# Patient Record
Sex: Female | Born: 1960
Health system: Southern US, Community
[De-identification: ages and names within clinical notes are randomized; demographics above are authoritative.]

## PROBLEM LIST (undated history)

## (undated) DIAGNOSIS — K635 Polyp of colon: Secondary | ICD-10-CM

## (undated) DIAGNOSIS — F329 Major depressive disorder, single episode, unspecified: Secondary | ICD-10-CM

## (undated) DIAGNOSIS — F32A Depression, unspecified: Secondary | ICD-10-CM

## (undated) DIAGNOSIS — Z923 Personal history of irradiation: Secondary | ICD-10-CM

## (undated) DIAGNOSIS — Z853 Personal history of malignant neoplasm of breast: Secondary | ICD-10-CM

## (undated) DIAGNOSIS — E7800 Pure hypercholesterolemia, unspecified: Secondary | ICD-10-CM

## (undated) DIAGNOSIS — D071 Carcinoma in situ of vulva: Secondary | ICD-10-CM

## (undated) HISTORY — DX: Polyp of colon: K63.5

## (undated) HISTORY — PX: CERVICAL BIOPSY  W/ LOOP ELECTRODE EXCISION: SUR135

## (undated) HISTORY — PX: GYNECOLOGIC CRYOSURGERY: SHX857

## (undated) HISTORY — PX: OTHER SURGICAL HISTORY: SHX169

## (undated) HISTORY — PX: BREAST SURGERY: SHX581

## (undated) HISTORY — DX: Personal history of irradiation: Z92.3

## (undated) HISTORY — DX: Major depressive disorder, single episode, unspecified: F32.9

## (undated) HISTORY — DX: Pure hypercholesterolemia, unspecified: E78.00

## (undated) HISTORY — DX: Depression, unspecified: F32.A

## (undated) HISTORY — PX: LAPAROSCOPIC ASSISTED VAGINAL HYSTERECTOMY: SHX5398

## (undated) HISTORY — PX: ABDOMINAL SURGERY: SHX537

---

## 1998-08-31 ENCOUNTER — Other Ambulatory Visit: Admission: RE | Admit: 1998-08-31 | Discharge: 1998-08-31 | Payer: Self-pay | Admitting: Gynecology

## 1998-12-20 ENCOUNTER — Other Ambulatory Visit: Admission: RE | Admit: 1998-12-20 | Discharge: 1998-12-20 | Payer: Self-pay | Admitting: Gynecology

## 1998-12-21 ENCOUNTER — Ambulatory Visit (HOSPITAL_BASED_OUTPATIENT_CLINIC_OR_DEPARTMENT_OTHER): Admission: RE | Admit: 1998-12-21 | Discharge: 1998-12-21 | Payer: Self-pay | Admitting: Orthopedic Surgery

## 2000-04-22 HISTORY — PX: OTHER SURGICAL HISTORY: SHX169

## 2000-04-23 ENCOUNTER — Other Ambulatory Visit: Admission: RE | Admit: 2000-04-23 | Discharge: 2000-04-23 | Payer: Self-pay | Admitting: Gynecology

## 2000-09-03 ENCOUNTER — Encounter (INDEPENDENT_AMBULATORY_CARE_PROVIDER_SITE_OTHER): Payer: Self-pay | Admitting: Specialist

## 2000-09-03 ENCOUNTER — Other Ambulatory Visit: Admission: RE | Admit: 2000-09-03 | Discharge: 2000-09-03 | Payer: Self-pay | Admitting: Gynecology

## 2001-05-13 ENCOUNTER — Other Ambulatory Visit: Admission: RE | Admit: 2001-05-13 | Discharge: 2001-05-13 | Payer: Self-pay | Admitting: Gynecology

## 2002-06-17 ENCOUNTER — Other Ambulatory Visit: Admission: RE | Admit: 2002-06-17 | Discharge: 2002-06-17 | Payer: Self-pay | Admitting: Gynecology

## 2002-06-25 ENCOUNTER — Other Ambulatory Visit: Admission: RE | Admit: 2002-06-25 | Discharge: 2002-06-25 | Payer: Self-pay

## 2002-07-11 ENCOUNTER — Ambulatory Visit (HOSPITAL_COMMUNITY): Admission: RE | Admit: 2002-07-11 | Discharge: 2002-07-11 | Payer: Self-pay | Admitting: General Surgery

## 2002-07-11 ENCOUNTER — Encounter (INDEPENDENT_AMBULATORY_CARE_PROVIDER_SITE_OTHER): Payer: Self-pay | Admitting: Specialist

## 2002-07-11 ENCOUNTER — Encounter: Payer: Self-pay | Admitting: General Surgery

## 2002-08-05 ENCOUNTER — Ambulatory Visit (HOSPITAL_BASED_OUTPATIENT_CLINIC_OR_DEPARTMENT_OTHER): Admission: RE | Admit: 2002-08-05 | Discharge: 2002-08-05 | Payer: Self-pay | Admitting: General Surgery

## 2002-08-05 ENCOUNTER — Encounter: Payer: Self-pay | Admitting: General Surgery

## 2002-08-05 ENCOUNTER — Encounter (INDEPENDENT_AMBULATORY_CARE_PROVIDER_SITE_OTHER): Payer: Self-pay | Admitting: *Deleted

## 2002-08-19 ENCOUNTER — Ambulatory Visit: Admission: RE | Admit: 2002-08-19 | Discharge: 2002-11-06 | Payer: Self-pay | Admitting: *Deleted

## 2003-10-26 ENCOUNTER — Other Ambulatory Visit: Admission: RE | Admit: 2003-10-26 | Discharge: 2003-10-26 | Payer: Self-pay | Admitting: Gynecology

## 2004-01-27 ENCOUNTER — Ambulatory Visit: Admission: RE | Admit: 2004-01-27 | Discharge: 2004-01-27 | Payer: Self-pay | Admitting: Oncology

## 2004-08-21 ENCOUNTER — Ambulatory Visit: Payer: Self-pay | Admitting: Oncology

## 2004-10-31 ENCOUNTER — Other Ambulatory Visit: Admission: RE | Admit: 2004-10-31 | Discharge: 2004-10-31 | Payer: Self-pay | Admitting: Gynecology

## 2004-11-24 ENCOUNTER — Ambulatory Visit: Payer: Self-pay | Admitting: Oncology

## 2005-02-24 ENCOUNTER — Encounter: Admission: RE | Admit: 2005-02-24 | Discharge: 2005-02-24 | Payer: Self-pay | Admitting: Gynecology

## 2005-03-01 ENCOUNTER — Encounter: Admission: RE | Admit: 2005-03-01 | Discharge: 2005-03-01 | Payer: Self-pay | Admitting: Gynecology

## 2005-03-02 ENCOUNTER — Encounter (INDEPENDENT_AMBULATORY_CARE_PROVIDER_SITE_OTHER): Payer: Self-pay | Admitting: *Deleted

## 2005-03-02 ENCOUNTER — Ambulatory Visit (HOSPITAL_COMMUNITY): Admission: RE | Admit: 2005-03-02 | Discharge: 2005-03-02 | Payer: Self-pay | Admitting: Gynecology

## 2005-03-02 ENCOUNTER — Ambulatory Visit (HOSPITAL_BASED_OUTPATIENT_CLINIC_OR_DEPARTMENT_OTHER): Admission: RE | Admit: 2005-03-02 | Discharge: 2005-03-02 | Payer: Self-pay | Admitting: Gynecology

## 2005-03-02 ENCOUNTER — Observation Stay (HOSPITAL_COMMUNITY): Admission: AD | Admit: 2005-03-02 | Discharge: 2005-03-03 | Payer: Self-pay | Admitting: Gynecology

## 2005-03-12 ENCOUNTER — Observation Stay (HOSPITAL_COMMUNITY): Admission: AD | Admit: 2005-03-12 | Discharge: 2005-03-13 | Payer: Self-pay | Admitting: Gynecology

## 2005-03-15 ENCOUNTER — Encounter: Payer: Self-pay | Admitting: Gynecology

## 2005-03-15 ENCOUNTER — Inpatient Hospital Stay (HOSPITAL_COMMUNITY): Admission: AD | Admit: 2005-03-15 | Discharge: 2005-03-26 | Payer: Self-pay | Admitting: General Surgery

## 2005-05-22 ENCOUNTER — Ambulatory Visit: Payer: Self-pay | Admitting: Oncology

## 2005-12-21 ENCOUNTER — Ambulatory Visit: Payer: Self-pay | Admitting: Oncology

## 2006-01-03 ENCOUNTER — Other Ambulatory Visit: Admission: RE | Admit: 2006-01-03 | Discharge: 2006-01-03 | Payer: Self-pay | Admitting: Gynecology

## 2006-06-20 ENCOUNTER — Ambulatory Visit: Payer: Self-pay | Admitting: Oncology

## 2006-06-26 LAB — CBC WITH DIFFERENTIAL/PLATELET
BASO%: 0.4 % (ref 0.0–2.0)
EOS%: 1.3 % (ref 0.0–7.0)
MCH: 34.1 pg — ABNORMAL HIGH (ref 26.0–34.0)
MCHC: 34.5 g/dL (ref 32.0–36.0)
NEUT%: 61 % (ref 39.6–76.8)
RDW: 12.7 % (ref 11.3–14.5)
lymph#: 2.5 10*3/uL (ref 0.9–3.3)

## 2006-06-26 LAB — COMPREHENSIVE METABOLIC PANEL
ALT: 11 U/L (ref 0–40)
AST: 14 U/L (ref 0–37)
Alkaline Phosphatase: 69 U/L (ref 39–117)
Calcium: 9.3 mg/dL (ref 8.4–10.5)
Chloride: 104 mEq/L (ref 96–112)
Creatinine, Ser: 1.27 mg/dL — ABNORMAL HIGH (ref 0.40–1.20)
Potassium: 3.6 mEq/L (ref 3.5–5.3)

## 2006-07-11 IMAGING — CR DG CHEST 2V
2 series · 2 of 2 positions shown · non-contrast
Comparison: none

CLINICAL DATA: Pre-op evaluation for hysterectomy.  Dysfunctional uterine bleeding. 
 2-VIEW CHEST:
 Two view chest shows no focal consolidation, edema, or effusion.  There are single 15 mm nodular opacities projecting over each lung base almost assuredly related to nipple shadows, but repeat frontal radiograph with nipple markers is recommended to confirm.  The heart size is normal.  Imaged bony structures are intact.

[view not recorded (1 of 2)]
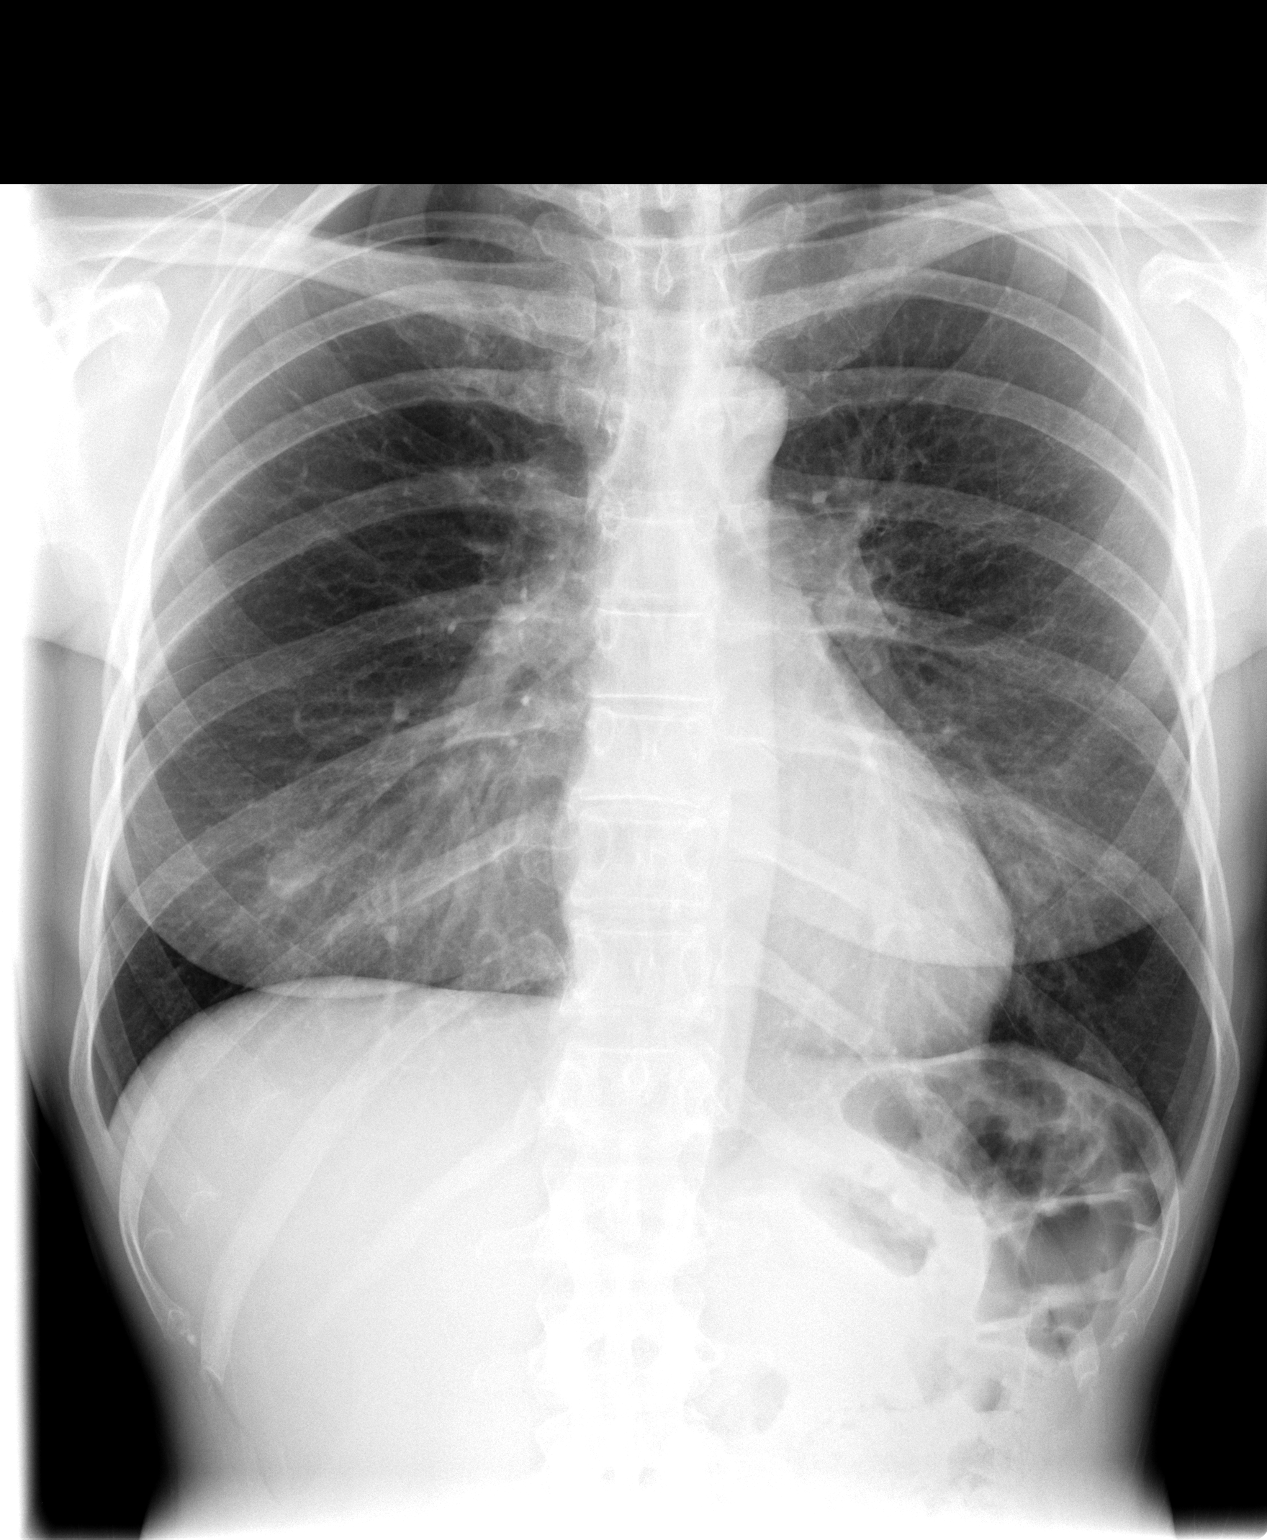

[view not recorded (2 of 2)]
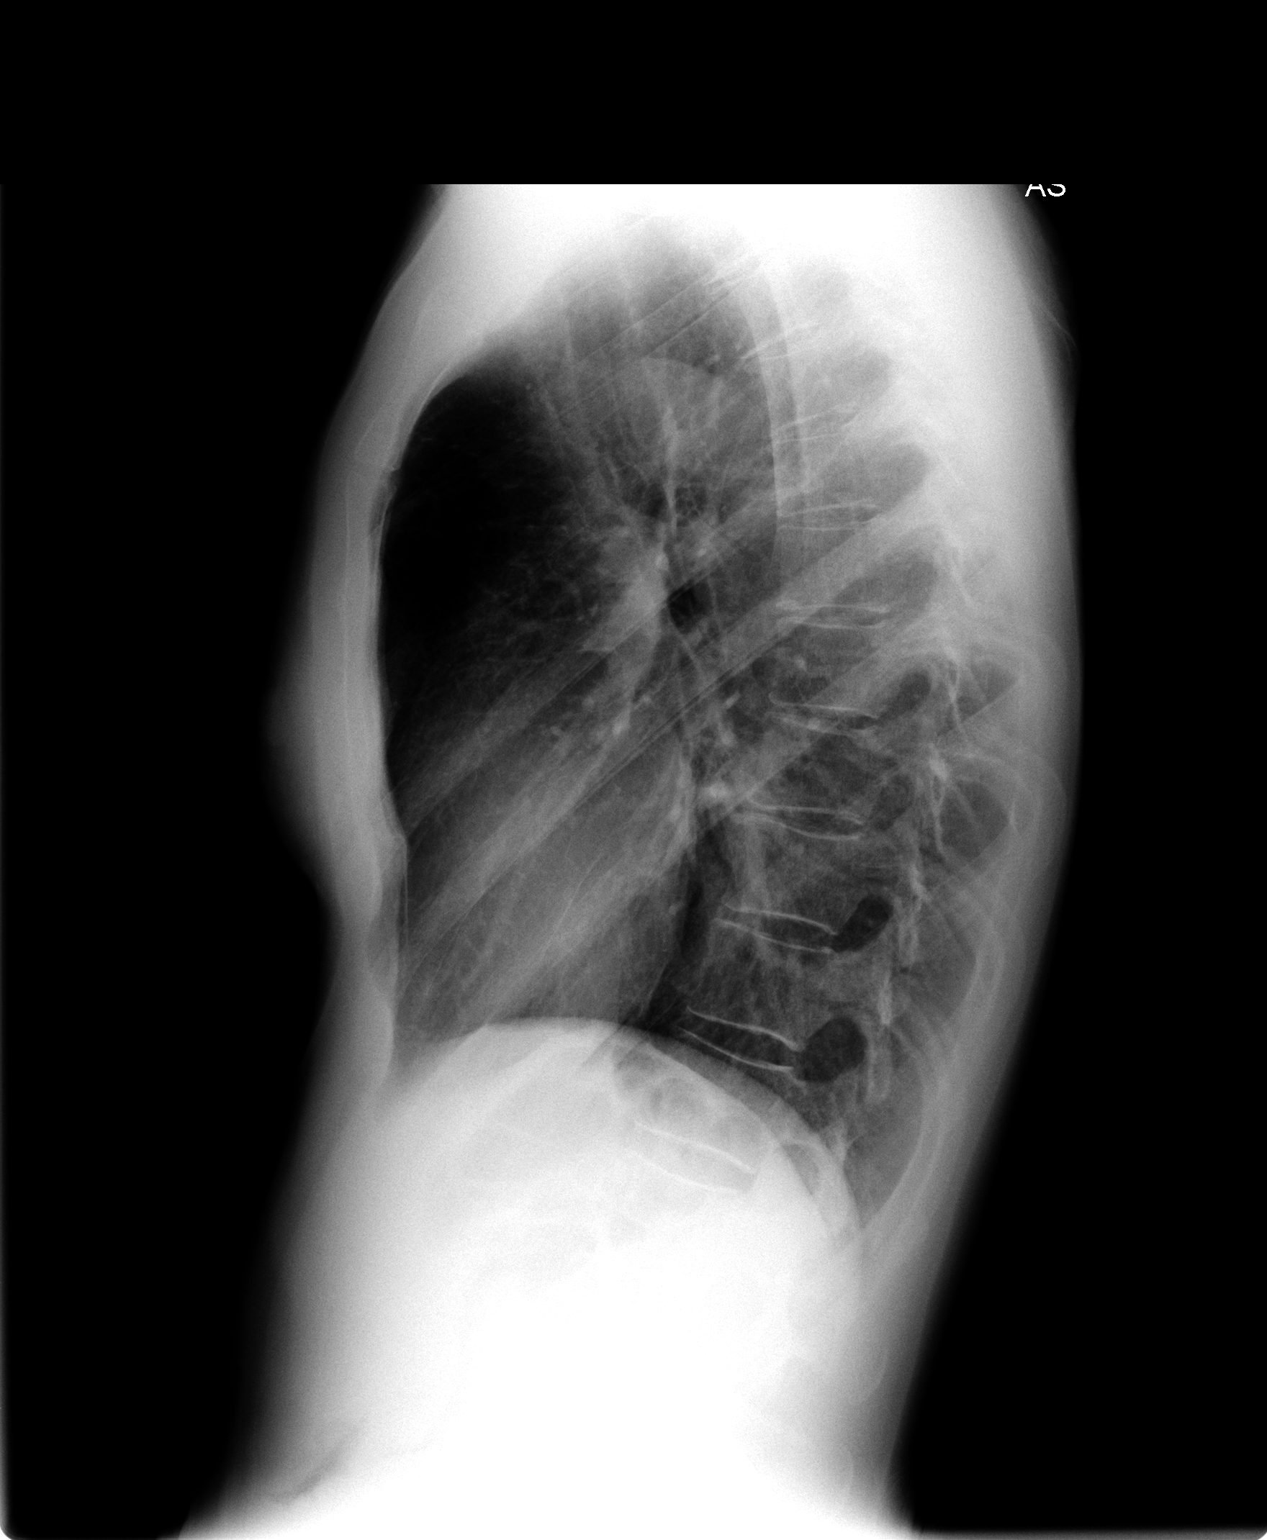

[2 of 2 positions shown; findings below may reference images not displayed]

IMPRESSION: 1.  No acute cardiopulmonary process.  
 2.  Round nodular opacities, one over each lung base are almost assuredly nipple shadows, but a repeat frontal radiograph with nipple markers is recommended to confirm.

## 2006-07-30 IMAGING — CR DG ABDOMEN ACUTE W/ 1V CHEST
3 series · 3 of 3 positions shown · non-contrast
Comparison: none

CLINICAL DATA: Two weeks status laparoscopic assisted vaginal hysterectomy.  Distended abdomen.  Bloating.  Pain.  Evaluate for obstruction.  Bowel sounds are present.
ACUTE ABDOMEN WITH CHEST:
Supine and erect films of the abdomen demonstrate distended small bowel loops with air-fluid levels on upright exam.  There is a small amount of colonic gas present.  The patient had a CT on 03/12/05 and has cleared the oral contrast from that examination.  Findings may be due to partial small bowel obstruction.
Upright PA chest demonstrates a normal heart size, clear lungs and no free intraperitoneal air.

[view not recorded (1 of 3)]
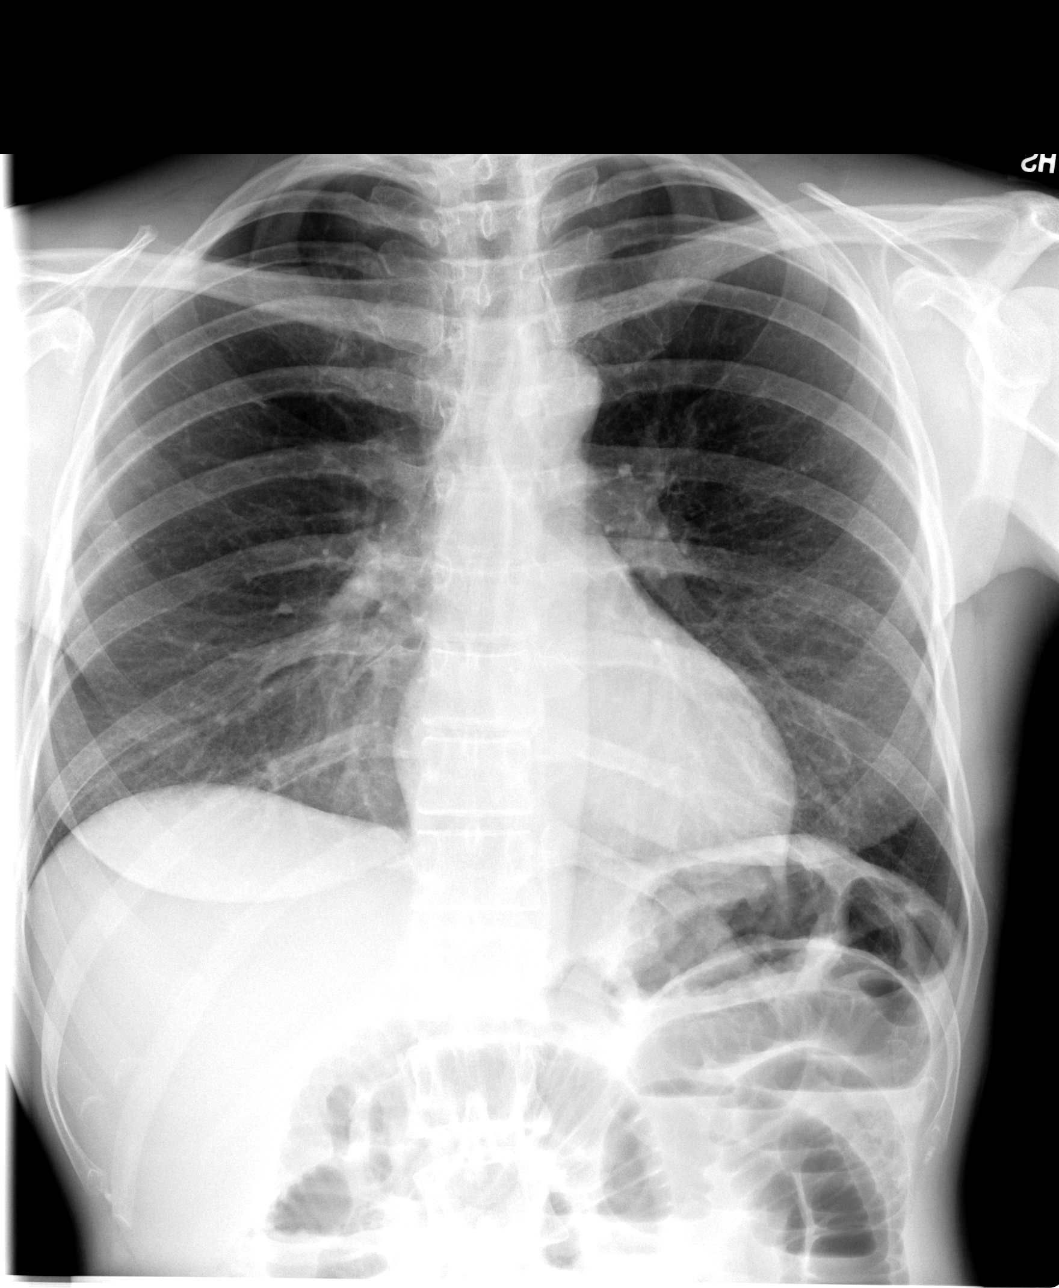

[view not recorded (2 of 3)]
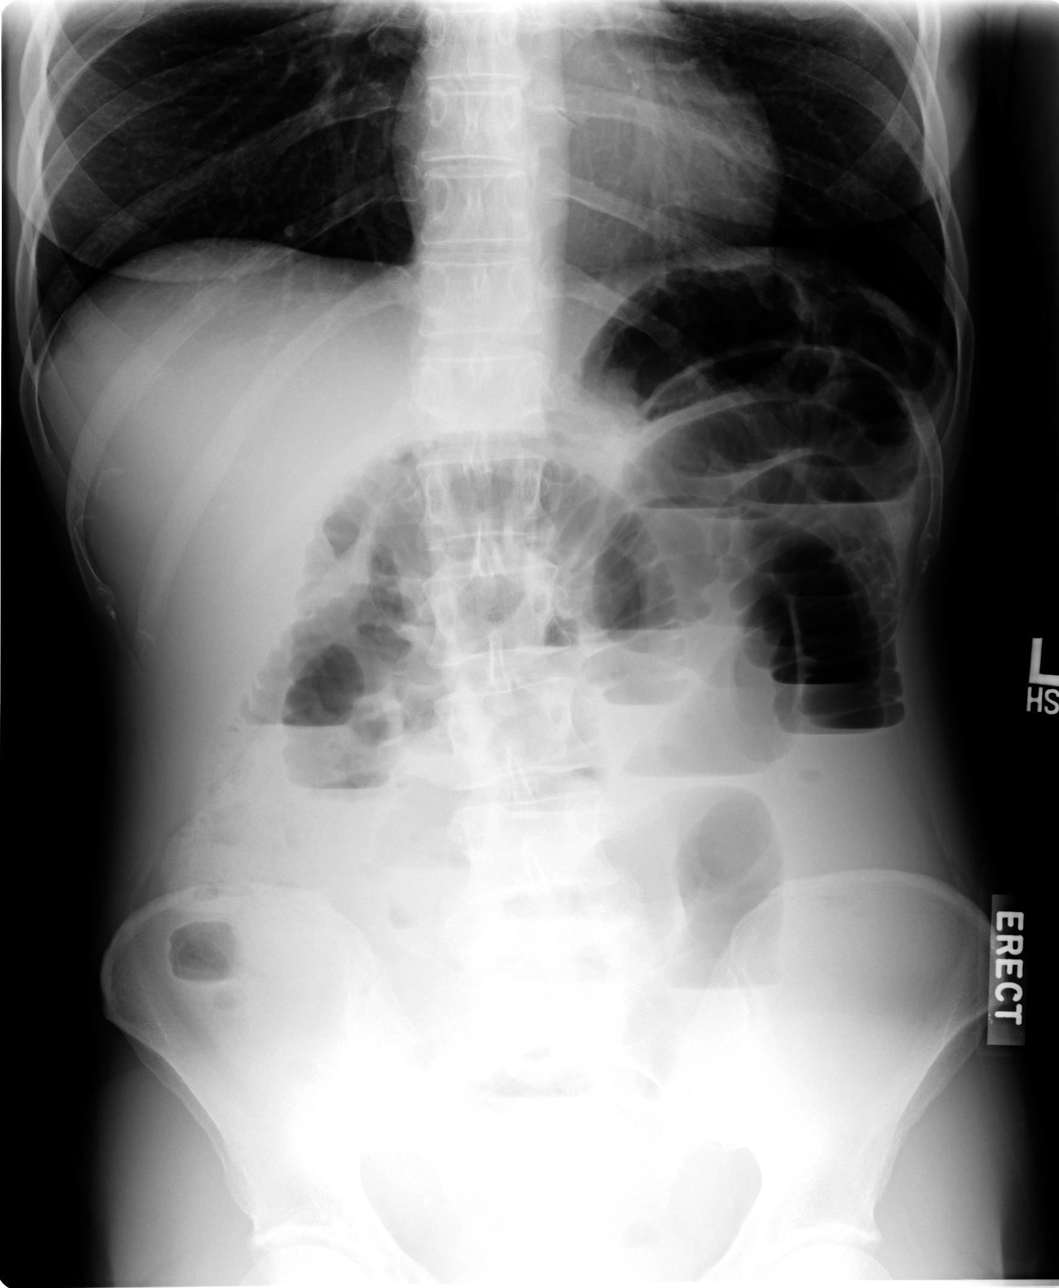

[view not recorded (3 of 3)]
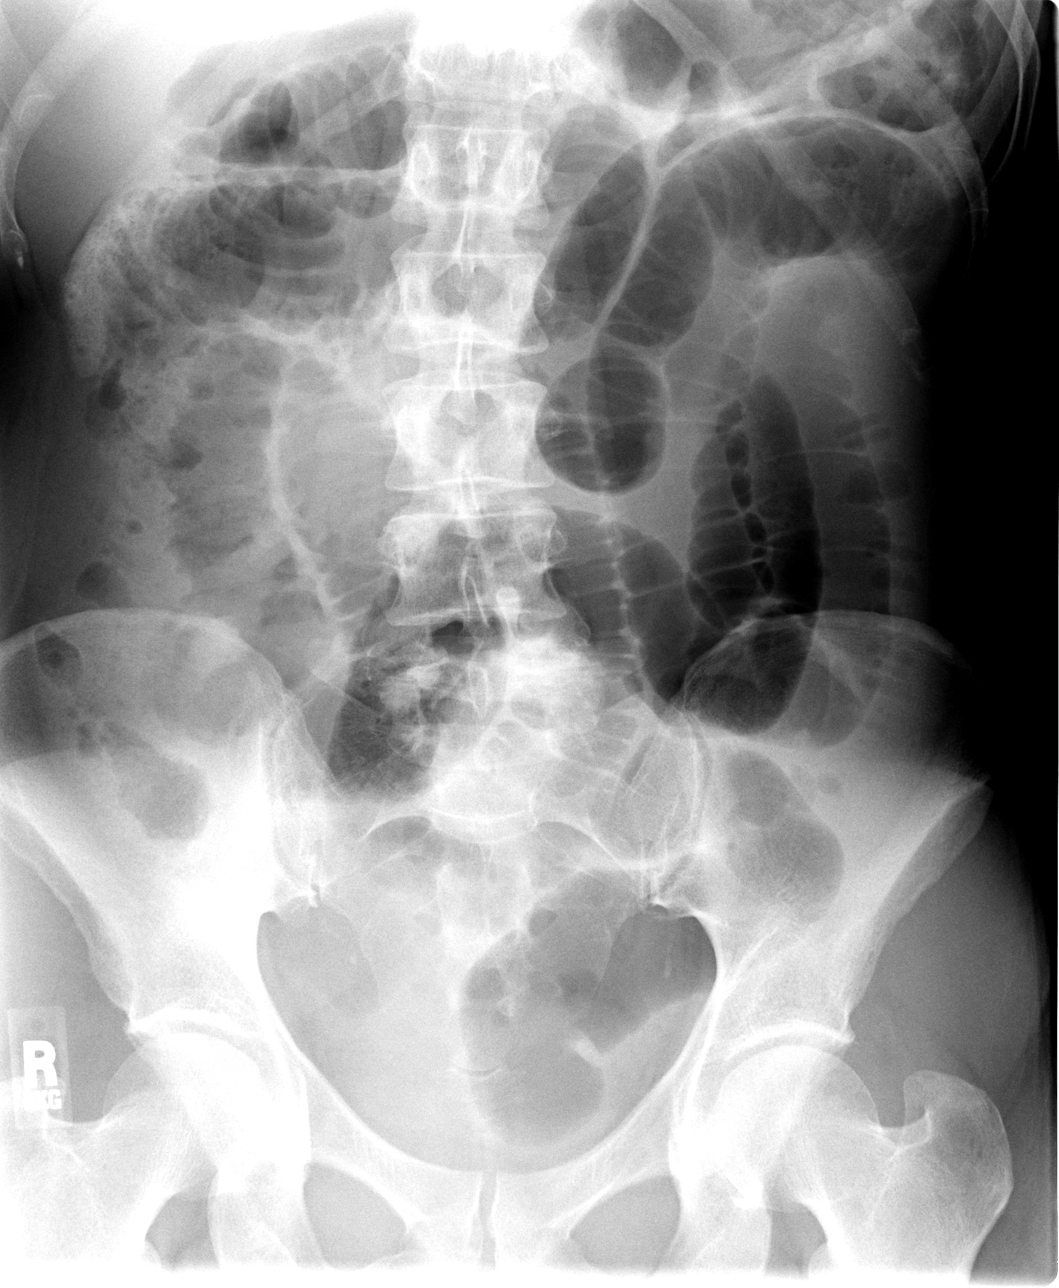

[3 of 3 positions shown; findings below may reference images not displayed]

IMPRESSION: Distended small bowel loops with air-fluid levels on upright exam.  Findings suggest partial small bowel obstruction.

## 2006-12-13 ENCOUNTER — Ambulatory Visit: Payer: Self-pay | Admitting: Oncology

## 2006-12-17 LAB — CBC WITH DIFFERENTIAL/PLATELET
Basophils Absolute: 0 10*3/uL (ref 0.0–0.1)
Eosinophils Absolute: 0.1 10*3/uL (ref 0.0–0.5)
HCT: 36.6 % (ref 34.8–46.6)
LYMPH%: 26.3 % (ref 14.0–48.0)
MCV: 96 fL (ref 81.0–101.0)
MONO%: 5.5 % (ref 0.0–13.0)
NEUT#: 5.9 10*3/uL (ref 1.5–6.5)
NEUT%: 66.4 % (ref 39.6–76.8)
Platelets: 225 10*3/uL (ref 145–400)
RBC: 3.81 10*6/uL (ref 3.70–5.32)

## 2006-12-17 LAB — COMPREHENSIVE METABOLIC PANEL
Albumin: 4.3 g/dL (ref 3.5–5.2)
CO2: 28 mEq/L (ref 19–32)
Calcium: 9.5 mg/dL (ref 8.4–10.5)
Chloride: 105 mEq/L (ref 96–112)
Glucose, Bld: 89 mg/dL (ref 70–99)
Sodium: 140 mEq/L (ref 135–145)
Total Bilirubin: 0.4 mg/dL (ref 0.3–1.2)
Total Protein: 6.7 g/dL (ref 6.0–8.3)

## 2006-12-17 LAB — LACTATE DEHYDROGENASE: LDH: 156 U/L (ref 94–250)

## 2006-12-17 LAB — CANCER ANTIGEN 27.29: CA 27.29: 15 U/mL (ref 0–39)

## 2007-01-08 ENCOUNTER — Other Ambulatory Visit: Admission: RE | Admit: 2007-01-08 | Discharge: 2007-01-08 | Payer: Self-pay | Admitting: Gynecology

## 2007-06-13 ENCOUNTER — Ambulatory Visit: Payer: Self-pay | Admitting: Oncology

## 2007-06-17 LAB — CBC WITH DIFFERENTIAL/PLATELET
BASO%: 0.4 % (ref 0.0–2.0)
EOS%: 0.6 % (ref 0.0–7.0)
MCH: 34.1 pg — ABNORMAL HIGH (ref 26.0–34.0)
MCHC: 35.1 g/dL (ref 32.0–36.0)
MONO%: 6.1 % (ref 0.0–13.0)
RBC: 3.71 10*6/uL (ref 3.70–5.32)
RDW: 13 % (ref 11.3–14.5)
lymph#: 2.1 10*3/uL (ref 0.9–3.3)

## 2007-06-18 LAB — COMPREHENSIVE METABOLIC PANEL
ALT: 14 U/L (ref 0–35)
AST: 20 U/L (ref 0–37)
Albumin: 4.2 g/dL (ref 3.5–5.2)
Alkaline Phosphatase: 59 U/L (ref 39–117)
Calcium: 9.4 mg/dL (ref 8.4–10.5)
Chloride: 108 mEq/L (ref 96–112)
Potassium: 3.5 mEq/L (ref 3.5–5.3)

## 2007-12-27 ENCOUNTER — Ambulatory Visit: Payer: Self-pay | Admitting: Oncology

## 2008-02-13 ENCOUNTER — Other Ambulatory Visit: Admission: RE | Admit: 2008-02-13 | Discharge: 2008-02-13 | Payer: Self-pay | Admitting: Gynecology

## 2008-09-04 ENCOUNTER — Encounter: Admission: RE | Admit: 2008-09-04 | Discharge: 2008-09-04 | Payer: Self-pay | Admitting: Oncology

## 2008-12-29 ENCOUNTER — Ambulatory Visit: Payer: Self-pay | Admitting: Oncology

## 2009-01-01 LAB — CBC WITH DIFFERENTIAL/PLATELET
BASO%: 0.5 % (ref 0.0–2.0)
Eosinophils Absolute: 0.1 10*3/uL (ref 0.0–0.5)
LYMPH%: 27 % (ref 14.0–49.7)
MCHC: 34.3 g/dL (ref 31.5–36.0)
MCV: 97.9 fL (ref 79.5–101.0)
MONO#: 0.5 10*3/uL (ref 0.1–0.9)
MONO%: 5.8 % (ref 0.0–14.0)
NEUT#: 5.4 10*3/uL (ref 1.5–6.5)
Platelets: 251 10*3/uL (ref 145–400)
RBC: 3.84 10*6/uL (ref 3.70–5.45)
RDW: 12.6 % (ref 11.2–14.5)
WBC: 8.2 10*3/uL (ref 3.9–10.3)

## 2009-01-04 LAB — COMPREHENSIVE METABOLIC PANEL
AST: 21 U/L (ref 0–37)
BUN: 11 mg/dL (ref 6–23)
CO2: 26 mEq/L (ref 19–32)
Calcium: 9.4 mg/dL (ref 8.4–10.5)
Chloride: 107 mEq/L (ref 96–112)
Creatinine, Ser: 1.02 mg/dL (ref 0.40–1.20)
Glucose, Bld: 80 mg/dL (ref 70–99)
Sodium: 139 mEq/L (ref 135–145)

## 2009-01-04 LAB — VITAMIN D 25 HYDROXY (VIT D DEFICIENCY, FRACTURES): Vit D, 25-Hydroxy: 39 ng/mL (ref 30–89)

## 2009-01-04 LAB — CANCER ANTIGEN 27.29: CA 27.29: 20 U/mL (ref 0–39)

## 2009-01-04 LAB — LACTATE DEHYDROGENASE: LDH: 191 U/L (ref 94–250)

## 2009-03-22 ENCOUNTER — Ambulatory Visit: Payer: Self-pay | Admitting: Gynecology

## 2009-03-22 ENCOUNTER — Encounter: Payer: Self-pay | Admitting: Gynecology

## 2009-03-22 ENCOUNTER — Other Ambulatory Visit: Admission: RE | Admit: 2009-03-22 | Discharge: 2009-03-22 | Payer: Self-pay | Admitting: Gynecology

## 2010-01-04 ENCOUNTER — Ambulatory Visit: Payer: Self-pay | Admitting: Oncology

## 2010-02-25 ENCOUNTER — Ambulatory Visit: Payer: Self-pay | Admitting: Oncology

## 2010-03-24 ENCOUNTER — Other Ambulatory Visit: Admission: RE | Admit: 2010-03-24 | Discharge: 2010-03-24 | Payer: Self-pay | Admitting: Gynecology

## 2010-03-24 ENCOUNTER — Ambulatory Visit: Payer: Self-pay | Admitting: Gynecology

## 2010-03-31 ENCOUNTER — Ambulatory Visit: Payer: Self-pay | Admitting: Gynecology

## 2010-04-05 ENCOUNTER — Ambulatory Visit: Payer: Self-pay | Admitting: Oncology

## 2010-04-07 LAB — COMPREHENSIVE METABOLIC PANEL
ALT: 9 U/L (ref 0–35)
AST: 18 U/L (ref 0–37)
Alkaline Phosphatase: 57 U/L (ref 39–117)
BUN: 11 mg/dL (ref 6–23)
Chloride: 106 mEq/L (ref 96–112)
Creatinine, Ser: 0.94 mg/dL (ref 0.40–1.20)
Glucose, Bld: 93 mg/dL (ref 70–99)
Potassium: 4.2 mEq/L (ref 3.5–5.3)
Sodium: 143 mEq/L (ref 135–145)

## 2010-04-07 LAB — CBC WITH DIFFERENTIAL/PLATELET
BASO%: 0.5 % (ref 0.0–2.0)
EOS%: 1.4 % (ref 0.0–7.0)
MCHC: 34.4 g/dL (ref 31.5–36.0)
MCV: 98.9 fL (ref 79.5–101.0)
MONO#: 0.4 10*3/uL (ref 0.1–0.9)
MONO%: 5.9 % (ref 0.0–14.0)
NEUT%: 68.2 % (ref 38.4–76.8)

## 2010-04-07 LAB — LACTATE DEHYDROGENASE: LDH: 158 U/L (ref 94–250)

## 2010-04-07 LAB — CANCER ANTIGEN 27.29: CA 27.29: 18 U/mL (ref 0–39)

## 2010-04-18 ENCOUNTER — Ambulatory Visit: Payer: Self-pay | Admitting: Gynecology

## 2010-04-21 ENCOUNTER — Ambulatory Visit: Payer: Self-pay | Admitting: Gynecology

## 2010-04-22 ENCOUNTER — Ambulatory Visit: Payer: Self-pay | Admitting: Gynecology

## 2010-04-22 ENCOUNTER — Ambulatory Visit (HOSPITAL_BASED_OUTPATIENT_CLINIC_OR_DEPARTMENT_OTHER): Admission: RE | Admit: 2010-04-22 | Discharge: 2010-04-22 | Payer: Self-pay | Admitting: Gynecology

## 2010-04-29 ENCOUNTER — Ambulatory Visit: Payer: Self-pay | Admitting: Gynecology

## 2010-05-12 ENCOUNTER — Ambulatory Visit: Payer: Self-pay | Admitting: Gynecology

## 2010-06-09 ENCOUNTER — Ambulatory Visit: Payer: Self-pay | Admitting: Gynecology

## 2010-11-02 DIAGNOSIS — K635 Polyp of colon: Secondary | ICD-10-CM

## 2010-11-02 HISTORY — DX: Polyp of colon: K63.5

## 2011-02-17 NOTE — H&P (Signed)
[name], [name]            ACCOUNT NO.:  [id number]   MEDICAL RECORD NO.:  [id number]          PATIENT TYPE:  INP   LOCATION:  9303                          FACILITY:  [facility]   PHYSICIAN:  Charles A. Delcambre, MDDATE OF BIRTH:  August 23, 1961   DATE OF ADMISSION:  03/12/2005  DATE OF DISCHARGE:                                HISTORY & PHYSICAL   CHIEF COMPLAINT:  Fever postoperative.   HISTORY OF PRESENT ILLNESS:  The patient is a 50 year old para 1, 0, 0, 1,  status post laparoscopic assisted vaginal hysterectomy for benign reasons  with Dr. Lily Peer on March 02, 2005. She now complains of increasing bloating  type of gas pain starting last Friday, 2 days ago. Increasing in the right  lower quadrant and today developing fever that she checked at home.  Temperature to 102 degrees Fahrenheit. She has no nausea and vomiting. Last  bowel movement was 4 days ago, regular bowel movements prior to that time,  no bowel movement since that time. She does have continued flatus. She  denies urinary complaints, no chills, nausea, vomiting. No urgency,  frequency, dysuria. She has no vaginal discharge or purulent discharge or  bleeding.  No flank pain or back pain. No incisional drainage or erythema.   PAST MEDICAL HISTORY:  1.  Left breast cancer treated by lumpectomy alone.  2.  History of cervical dysplasia.   PAST SURGICAL HISTORY:  Laparoscopic assisted vaginal hysterectomy. Breast  lumpectomy.   CURRENT MEDICATIONS:  None.   ALLERGIES:  No known drug allergies.   SOCIAL HISTORY:  One-half pack per day cigarettes. No drugs or alcohol other  than social alcohol. The patient is married and lives with her husband.   FAMILY HISTORY:  Unrelated.   REVIEW OF SYSTEMS:  As noted above.   PHYSICAL EXAMINATION:  GENERAL:  Alert and oriented x3.  VITAL SIGNS:  Temperature here is 98.7, pulse 91, respirations 20, blood  pressure 115/62.  HEENT EXAM:  Grossly within normal limits.  NECK:   Supple without thyromegaly or adenopathy.  LUNGS:  Clear bilaterally.  HEART:  Regular rate and rhythm without murmur, rub, or gallop.  BREASTS:  Symmetrical. Remainder of examination of breasts deferred.  ABDOMEN:  Soft, flat, nondistended. Incisions are healing well, no erythema,  no drainage. Upper quadrant is soft, flat and nontender. Left lower quadrant  nontender.  Flank nontender bilaterally. Suprapubic area nontender. Right lower quadrant  without guarding. There is mild tenderness to deep palpation.  PELVIC EXAM:  Normal female genitalia, BUS within normal limits. Vault  without discharge or lesions. Cuff is healing well. There is no drainage  from the cuff, the cuff is essentially nontender. Bimanual examination - I  cannot detect a mass at the cuff and this exam was within normal limits as  far as tenderness goes.  EXTREMITIES: No significant edema, and calves are nontender.   ASSESSMENT:  Rule out intraperitoneal pelvic abscess versus hematoma with  possible infection of hematoma.   PLAN:  CT of the abdomen and pelvis with contrast. Laboratories studies were  drawn and returned: CBC: White blood cell count 20.1  thousand, hemoglobin  12.1, hematocrit 36.0, platelets 232,000, neutrophils 92, lymphocytes 4.  Urinalysis was negative on a cath-specimen with 0-2 white blood cell  count's. CMP was within normal limits.  Initial CT of abd and pelvis  returned verbal report to me 2.5x0.5cm fluid collection in the pelvis  consistent with small abscess.  Large amount of stool was seen in the colon.  Will start antiotic coverage with Zosyn 3.375 mg q 6 hours IV for good  gynecological coverage and abscess penetration.  If fails to respond,  broaden to triple antibiotic coverage.  Draw blood cultures at two sites  before antibiotics are started.  Patient is in agreement with plan.       CAD/MEDQ  D:  03/12/2005  T:  03/12/2005  Job:  [id number]

## 2011-02-17 NOTE — Op Note (Signed)
[name], [name]            ACCOUNT NO.:  [id number]   MEDICAL RECORD NO.:  [id number]            PATIENT TYPE:   LOCATION:                                 FACILITY:   PHYSICIAN:  Sharlet Salina T. Hoxworth, M.D.  DATE OF BIRTH:   DATE OF PROCEDURE:  03/15/2005  DATE OF DISCHARGE:                                 OPERATIVE REPORT   PREOPERATIVE DIAGNOSIS:  Small-bowel obstruction.   POSTOPERATIVE DIAGNOSIS:  Small-bowel obstruction.   SURGICAL PROCEDURE:  Laparotomy and lysis of adhesions for small bowel  obstruction.   BRIEF HISTORY:  [name] is a 50 year old female who is 2 weeks  following laparoscopic-assisted vaginal hysterectomy and bilateral salpingo-  oophorectomy. She presents with progressive abdominal distension pressure  and nausea. See HPI for details for illness. CT scan today shows a high-  grade small-bowel obstruction with a transition zone in the right pelvis.  After discussion of options, we elected to proceed with laparotomy for bowel  obstruction. The nature of the procedure, risks of bleeding, infection,  intestinal injury, and recurrent obstruction were discussed and understood.  She is now brought to the operating room for this procedure.   DESCRIPTION OF PROCEDURE:  The patient brought to the operating room and  placed in the supine position on the table and a general endotracheal  anesthesia was induced. Foley catheter and nasogastric tube was placed. She  received preoperative broad-spectrum antibiotics. The abdomen widely  sterilely prepped and draped. A low midline incision was used and dissection  carried down to the subcutaneous tissue and midline fascia. The peritoneum  was entered under direct vision. There was a moderate amount of nonbloody  serous ascites that was suctioned. Small bowel loops were markedly  distended. These were traced down distally to the pelvis where about 15-20  cm from the ileocecal valve of the terminal ileum was  densely adherent to  the area of the vaginal cuff and right pelvic sidewall from inflammatory  dense adhesions from her recent surgery. The bowel seemed to be kinked and  completely obstructed, right at this area, with completely decompressed  terminal ileum right before the ileocecal valve and decompressed colon.  These adhesions were carefully broken up with blunt dissection and  eventually the cecum and the terminal ileum could be brought up out of the  pelvis into the wound. There were some areas of exudate and inflammatory  change over the distal small bowel and in the pelvis as well, but this  appeared to be essentially normal postoperative adhesions without abscess or  infection, clearly identified. The bowel was then traced proximally and was  completely freed back up to the proximal small bowel. There did not appear  to be any damage to the small bowel although it was markedly distended. The  pelvis was then thoroughly irrigated with warm saline. The viscera  returned to an anatomic position. The midline fascia was then closed with  running #1 PDS __________ incision and tied centrally. The subcutaneous  tissue was irrigated; and the skin closed with staples.  Sponge, needle, and  instrument counts were correct.  Dressing  sets were applied and the patient  taken to recovery in good condition.       BTH/MEDQ  D:  03/15/2005  T:  03/16/2005  Job:  [id number]   cc:   Gaetano Hawthorne. Lily Peer, M.D.  [address], [address]  [place]  [place] [address]  Fax: [phone]

## 2011-02-17 NOTE — Discharge Summary (Signed)
[name], [name]            ACCOUNT NO.:  [id number]   MEDICAL RECORD NO.:  [id number]          PATIENT TYPE:  INP   LOCATION:  9303                          FACILITY:  [facility]   PHYSICIAN:  Ivor Costa. Farrel Gobble, M.D. DATE OF BIRTH:  June 25, 1961   DATE OF ADMISSION:  03/12/2005  DATE OF DISCHARGE:  03/13/2005                                 DISCHARGE SUMMARY   PRINCIPAL DIAGNOSIS:  Postoperative fever.   HISTORY OF PRESENT ILLNESS:  The patient is status post LAVH-BSO done on  June 2 who presented with increased temperature of 102.  The patient had not  had a bowel movement in four days prior to presenting to the hospital but  had been having regular bowel movements before that point.  She does not  have a history of constipation.   HOSPITAL COURSE:  The patient had a CBC with differential, a urinalysis, and  a CAT scan with contrast.  The differential was remarkable for an elevated  white count 20,000.  CAT scan showed a small amount of fluid in the pelvis  which is not felt to be an abscess.  She is noted to have markedly dilated  loops of bowel with stool.  The patient was placed on Zosyn IV, she  defervesced immediately, as a matter of fact, she was afebrile at the time  of presentation to the hospital.  She remained afebrile throughout.  She  also started having bowel movements when she was here and had had four bowel  movements by the time she was sent home.  WBC at the time of discharge  decreased to 14,000.  The patient was discharged home with instructions to  follow up in the office on Monday, to maintain regular bowel movements, she  is given a prescription for Augmentin 875 mg b.i.d., #10.  She will continue  her normal postoperative instructions.       THL/MEDQ  D:  03/13/2005  T:  03/13/2005  Job:  [id number]

## 2011-02-17 NOTE — Op Note (Signed)
[name], [name]            ACCOUNT NO.:  [id number]   MEDICAL RECORD NO.:  [id number]          PATIENT TYPE:  AMB   LOCATION:  [facility]                         FACILITY:  [facility]   PHYSICIAN:  Juan H. Lily Peer, M.D.DATE OF BIRTH:  October 31, 1960   DATE OF PROCEDURE:  03/02/2005  DATE OF DISCHARGE:                                 OPERATIVE REPORT   SURGEON:  Juan H. Lily Peer, M.D.   FIRST ASSISTANT:  Daniel L. Eda Paschal, M.D.   INDICATIONS FOR OPERATION:  A 50 year old, gravida 1, para 1 with history of  ongoing dysfunctional bleeding, also left ovarian cyst. 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 Endometrial biopsy demonstrated endometrial polyps but no  hyperplasia.   PREOPERATIVE DIAGNOSES:  1.  Menorrhagia.  2.  Left ovarian cyst.  3.  History of left breast cancer.   POSTOPERATIVE DIAGNOSIS:  1.  Menorrhagia.  2.  Left ovarian cyst.  3.  History of left breast cancer.   ANESTHESIA:  General endotracheal anesthesia.   PROCEDURE PERFORMED:  Laparoscopic-assisted vaginal hysterectomy with  bilateral salpingo-oophorectomy.   FINDINGS:  The patient had normal appearing uterus and tubes. A 3 cm left  ovarian cyst was noted with smooth surface. No other abnormality was noted  in the peritoneal surface.   DESCRIPTION OF OPERATION:  After the patient was adequately counseled, she  was taken to the operating room where she underwent a successful general  endotracheal anesthesia. The patient received a gram of Ancef for  prophylaxis preoperatively and had PSA stockings for DVT prophylaxis. The  abdomen, vagina and perineum were prepped and draped in the usual sterile  fashion, a Foley catheter had been inserted to monitor urinary output. The  examination under anesthesia demonstrated slightly anteverted uterus. A  Hulka tenaculum was placed in an effort to manipulate the uterus and cervix  during the laparoscopic  portion of the procedure. A small stab incision was  made in the infraumbilical region whereby a Veress needle was introduced,  opening intra-abdominal pressure was approximately 5 mmHg. Approximately to  2.5 liters of carbon dioxide were insufflated into the peritoneal cavity.  Following this, a 10 mm trocar was inserted followed by insertion of two 5  mm trocars in the lower abdominal region under laparoscopic guidance. After  systematic inspection the pelvic cavity with the above-mentioned findings,  the right tube and ovary was placed under traction to identify the right  infundibulopelvic ligament. The course of the ureter was also identified.  The right infundibulopelvic ligament was cauterized and transected with the  tripolar unit all the way down to the level of the round ligament which also  was cauterized and transected. The anterior bladder flap was established. A  similar procedure was carried out on the contralateral side. The vaginal  portion of the procedure was undertaken next. The patient's leg was placed  in the high lithotomy position, the Hulka tenaculum was removed.  Two Lahey  thyroid clamps were placed in the anterior and posterior cervical lip in  order to proceed with the vaginal hysterectomy approach.  The cervical  vaginal fold was infiltrated with 2% lidocaine with 1:100,000 epinephrine  for a total 10 mL. A circumferential incision was made at the cervical  vaginal fold. A  posterior colpotomy was established. Both uterosacral  ligaments were clamped, cut and suture ligated with #0 Vicryl suture and  tagged. The remaining broad and cardinal ligaments were clamped, cut and  suture ligated with #0 Vicryl suture. After the anterior colpotomy was made,  the remaining pedicles were clamped, cut and suture ligated with #0 Vicryl  suture. The uterus, cervix and tubes were passed off the operative field.  After ascertaining adequate hemostasis, closure was started. The  posterior  vaginal cuff was closed in a running stitch in a baseball stitch fashion for  hemostasis and the vaginal cuff was then closed, anterior and posterior  vaginal cuff with interrupted sutures of #0 Vicryl suture. The vagina was  copiously irrigated with normal saline solution. The final portion of the  operation consisted of looking from the laparoscopic portion to make sure  that there was adequate hemostasis at the vaginal cuff which a small area  was noted. The center portion of vaginal cuff had a little bit of bleeding  which was contained within simple cauterization. After this, the pelvic  cavity was copiously irrigated with normal saline solution. No bleeding was  noted and both pedicles appeared to be dry as was the vaginal cuff. The  carbon dioxide was removed from the peritoneal cavity. The instruments were  removed. The 10 mm subumbilical port site, the fascia was closed with a  single suture of #0 Vicryl suture in a locking stitch fashion.  The skin  incisions at all three port sites were approximated with Dermabond glue.  Marcaine 0.25% for 10 mL was infiltrated in all three incision sites for  postoperative analgesia. The patient tolerated the procedure well. She was  extubated, transferred to recovery room with stable vital signs. Blood loss  for procedure was 75 mL. IV fluids 1300 mL of lactated Ringer's and urine  output was 100 mL.      JHF/MEDQ  D:  03/02/2005  T:  03/02/2005  Job:  [id number]

## 2011-02-17 NOTE — H&P (Signed)
[name], COSTANTINO            ACCOUNT NO.:  [id number]   MEDICAL RECORD NO.:  [id number]          PATIENT TYPE:  INP   LOCATION:  1415                         FACILITY:  [facility]   PHYSICIAN:  Sharlet Salina T. Hoxworth, M.D.DATE OF BIRTH:  1961-01-03   DATE OF ADMISSION:  03/15/2005  DATE OF DISCHARGE:                                HISTORY & PHYSICAL   CHIEF COMPLAINT:  Abdominal pressure and swelling.   HISTORY OF PRESENT ILLNESS:  Skylinn Vialpando is a 50 year old female who on  March 02, 2005, underwent an apparently uneventful laparoscopic-assisted  vaginal hysterectomy and bilateral salpingo-oophorectomy for dysfunctional  uterine bleeding, ovarian cyst, and uterine polyps while on Tamoxifen.  She  was discharged the following day doing well.  She initially had some flatus  and bowel movements in the early postoperative course.  However, about four  to five days postoperatively, she began to develop some progressive  abdominal swelling and bloating and pressure sensation.  She denies any  severe pain at any point.  There was no nausea or vomiting.  This was  initially treated with some laxatives and suppositories at home, but she  continued to develop gradual progressing abdominal distention.  Four days  ago, she had a fever spike to 102 degrees at home, and was re-admitted to  [facility].  At that time, abdominal distention was noted.  Her fever  did not recur.  White count was initially elevated at 20,000.  Cultures were  obtained and she was started on IV Zosyn.  CT scan of the abdomen and pelvis  was obtained which revealed apparent constipation with a lot of stool in the  colon and some dilatation of the small bowel, felt most consistent at that  point with ileus.  The patient was treated with enemas and actually had  several bowel movements.  Again, her fever completely deferevesced, white  count went down to 13,000, and she was discharged the following day.  She,  however, states that she continued to have abdominal distention despite the  bowel movements and over the last several days this has continued to  progress and is now at its worst since surgery.  She feels tight and  uncomfortable in her abdomen, but again, denies any severe pain.  She has  been nauseated and vomited once two days ago, but not since.  She has been  continuing to sip on liquids and avoid solid food.  She has again used  suppositories at home, having only one very small bowel movement yesterday.  She has had no further fever or chills.  No urinary symptoms.   PAST MEDICAL HISTORY:  1.  Surgery as above.  2.  Left lumpectomy for cancer of the breast, stage I, three years ago.  3.  Mild depression.   CURRENT MEDICATIONS:  1.  Wellbutrin one daily.  2.  Tamoxifen 10 mg b.i.d.  3.  Augmentin 875 mg b.i.d. started at her most recent hospitalization four      days ago.  4.  Motrin p.r.n.   ALLERGIES:  No known drug allergies.   SOCIAL HISTORY:  She smokes  1/2 pack of cigarettes per day, she does not  drink.  She is married and lives with her husband.   FAMILY HISTORY:  Noncontributory.   REVIEW OF SYSTEMS:  GENERAL:  No fever or chills.  She has had slight weight  loss.  RESPIRATORY:  No shortness of breath, cough, wheezing.  CARDIAC:  No  chest pain or palpitations.  BREASTS:  No breast lumps, nipple discharge.  ABDOMEN/GASTROINTESTINAL:  As above.  GENITOURINARY:  No urinary burning or  frequency.  No vaginal discharge or bleeding.  EXTREMITIES:  No swelling or  joint pain.   PHYSICAL EXAMINATION:  VITAL SIGNS:  Temperature is 97.9, pulse 100 and  regular, respirations 16, blood pressure 134/83.  GENERAL:  She is a thin white female in no acute distress.  SKIN:  Warm and dry, no rash or infection.  HEENT:  No palpable mass or thyromegaly.  Sclerae nonicteric.  Nares and  oropharynx clear.  LUNGS:  Clear to auscultation without wheezing or increased work of   breathing.  BREASTS:  No masses, tenderness, discharge.  Well-healed lumpectomy.  LYMPH NODES:  No cervical, supraclavicular, axillary, or inguinal nodes  palpable.  ABDOMEN:  Quiet distended and tympanitic.  There are well-healed  laparoscopic incisions without any mass or hernia palpable.  There is mild  diffuse tenderness, but no guarding, no palpable masses.  Bowel sounds are  high pitched and active.  EXTREMITIES:  No joint swelling or deformity.  NEUROLOGIC:  She is alert and oriented.  Motor and sensory exam is grossly  normal.   LABORATORY DATA:  Today's labs are pending.  On March 13, 2005, white count  was 13,300, hemoglobin 13.9.  Urine microscopic negative.  Electrolytes  unremarkable.  Albumin 3.1.   CT scan of the abdomen and pelvis was performed today which I reviewed.  This shows high-grade small bowel obstruction with markedly distended loops  of proximal small bowel and completely decompressed distal small bowel with  an apparent transition zone in the right pelvis.   ASSESSMENT AND PLAN:  High-grade small bowel obstruction two weeks following  laparoscopic-assisted vaginal hysterectomy and bilateral salpingo-  oophorectomy.  Clinically, this appears to be worsening and the patient is  having more discomfort and distention on a daily basis.  There appears to be  a discrete transition point, likely secondary to inflammatory adhesion.  I  certainly cannot rule out bowel injury or hernia.  As this appears to be  progressively worsening and she is now two weeks out, I have recommended  proceeding with laparotomy.  Options including NG suction and close  observation were discussed, but I would lean towards proceeding with  exploration.  The patient is in complete agreement.  She is admitted to  [facility] for this procedure.       BTH/MEDQ  D:  03/15/2005  T:  03/15/2005  Job:  [id number]   cc:   Gaetano Hawthorne. Lily Peer, M.D. [address], [address]   [place]  [place] [address]  Fax: [phone]

## 2011-02-17 NOTE — Op Note (Signed)
[name], [name]                      ACCOUNT NO.:  [id number]   MEDICAL RECORD NO.:  [id number]                   PATIENT TYPE:  AMB   LOCATION:  DSC                                  FACILITY:  [facility]   PHYSICIAN:  Sharlet Salina T. Hoxworth, M.D.          DATE OF BIRTH:  [birth year]   DATE OF PROCEDURE:  08/05/2002  DATE OF DISCHARGE:                                 OPERATIVE REPORT   PREOPERATIVE DIAGNOSES:  Cancer of the left breast.   POSTOPERATIVE DIAGNOSES:  Cancer of the left breast.   OPERATION PERFORMED:  1. Blue dye injection of left breast.  2. Left axillary sentinel lymph node biopsy.  3. Left breast lumpectomy.   SURGEON:  Lorne Skeens. Hoxworth, M.D.   ANESTHESIA:  Laryngeal mask general.   INDICATIONS FOR PROCEDURE:  The patient is a 50 year old white female who  recently underwent biopsy of a less than 1 cm mass in the outer aspect of  the left breast.  This revealed a tubular carcinoma.  Margins were  technically negative although this was done as a limited excisional biopsy.  After discussion of treatment options for initial surgical management, we  have elected to proceed with left axillary sentinel lymph node biopsy and re-  excision of the primary site in the left breast.  The nature of the  procedure, its indications and risks of bleeding, infection, possible need  for axillary dissection immediately or further surgery based on pathologic  findings were discussed and understood.  She is now brought to the operating  room for this procedure.   DESCRIPTION OF PROCEDURE:  Following injection of technetium sulfur colloid  two hours prior to surgery, the patient was brought to the operating room  and placed in supine position on the operating table and laryngeal mask  anesthesia was induced.  10 cc of Lymphazurin blue was injected subareolarly  and massaged for five minutes.  The left breast, axilla and arm were  sterilely prepped and draped.  A Neoprobe  was used to localize a definite  hot area in the axilla and a small transverse incision was made and  dissection carried down to subcutaneous tissue and the axilla bluntly  entered.  Immediately apparent was a bright blue lymphatic entering a bright  blue lymph node.  This was dissected away from the surrounding tissue with  cautery and completely excised.  Ex vivo the node had counts in excess of  5000 with background in the axilla of less than 100.  This was sent as a hot  blue axillary sentinel lymph node.  While waiting for this report, the  previous incision in the lateral breast was elliptically excised and the  previous biopsy cavity was completely excised with cautery obtaining further  margins in all directions.  Due to previous surgery, ended up being done in  a piecemeal fashion.  The entire biopsy cavity was excised, however.  Hemostasis was obtained in both wounds  with cautery.  The sentinel lymph  node Touch Preps returned as negative for malignant cells.  This incision  was then closed with interrupted subcutaneous 4-0 Monocryl and running  subcuticular 4-0 Monocryl and Steri-Strips.  Sponge, needle and instrument  counts were correct.  Dry sterile dressings were applied.  The patient was  taken to the recovery room in good condition.                                              Lorne Skeens. Hoxworth, M.D.   Tory Emerald  D:  08/05/2002  T:  08/05/2002  Job:  [id number]

## 2011-02-17 NOTE — Discharge Summary (Signed)
[name], [name]            ACCOUNT NO.:  [id number]   MEDICAL RECORD NO.:  [id number]          PATIENT TYPE:  INP   LOCATION:  1415                         FACILITY:  [facility]   PHYSICIAN:  Sharlet Salina T. Hoxworth, M.D.DATE OF BIRTH:  Apr 09, 1961   DATE OF ADMISSION:  03/15/2005  DATE OF DISCHARGE:  03/26/2005                                 DISCHARGE SUMMARY   DISCHARGE DIAGNOSIS:  Small bowel obstruction status post laparoscopic  assisted hysterectomy.   OPERATION/PROCEDURE:  Laparotomy and lysis of adhesions for small bowel  obstruction March 15, 2005.   HISTORY OF PRESENT ILLNESS:  [name] is a 50 year old female status  post laparoscopic assisted vaginal hysterectomy and bilateral salpingo-  oophorectomy approximately two weeks ago. She was discharged the day  following surgery with an apparently uncomplicated procedure in early  hospital course. However, the patient 4-5 days after surgery began to  develop increasing abdominal pressure, swelling and obstipation. Three days  ago, she was admitted on the GYN service for a fever of 102. CT scan showed  constipation and probable ileus, white count was 20,000, cultures were  negative. She was treated with a short course of IV antibiotics and had  improvement. The white count decreased to 13,000 and was discharged.  However, since discharge, she has had recurrent abdominal swelling,  distention, nausea, vomiting and pressure. No bowel movement x48 hours. CT  scan was repeated today showing high grade small bowel obstruction.   PAST MEDICAL HISTORY:  Surgery as above plus left lumpectomy for breast  cancer three years ago. Medically is followed for depression.   MEDICATIONS:  1.  Wellbutrin 1 daily.  2.  Tamoxifen 10 b.i.d.  3.  Augmentin 875 b.i.d. for the past four days.  4.  Motrin p.r.n.   ALLERGIES:  None.   Social history, family history, review of systems see detailed H&P.   PERTINENT PHYSICAL EXAM:  VITAL  SIGNS:  She is afebrile, pulse 100, vital  signs within normal limits.  GENERAL:  She is thin white female in no acute distress.  ABDOMEN:  Pertinent exam limited to the abdomen which is quite distended  with moderate diffuse tenderness, no guarding, no mass. CT scan shows high  grade small bowel obstruction with a transition zone in the right pelvis.   HOSPITAL COURSE:  Laparotomy for apparent high grade small bowel obstruction  with worsening clinical course was recommended and accepted. This was  performed on March 15, 2005 with some dense inflammatory adhesions in the  pelvis causing obstruction. Postoperatively, NG tube was left in place. She  initially had some recurrent abdominal distention over the first several  days and x-ray was consistent with ileus. Due to prolonged course, PICC line  and TNA were started on the fourth postoperative day. By the sixth  postoperative day, she was feeling a little bit better with increased  colonic and decreased small bowel gas. On the seventh postoperative day, she  had not had any flatus or bowel movement and remained distended. White count  was 11.2. There was no change on the eighth postoperative day. On the 10  postoperative  day, however, she had had some flatus and bowel movement and  felt better. Her abdomen was still moderately distended but less so. Her NG  tube was discontinued and she was started on a liquid diet which she  tolerated well. By June 25, she had had several bowel movements, a large  amount of flatus and abdomen was flat, soft and nontender. The wound was  primarily healed and staples were removed. Diet was advanced and she was  discharged home.   DISCHARGE MEDICATIONS:  Same as admission.   FOLLOW UP:  In my office in one week.       BTH/MEDQ  D:  05/01/2005  T:  05/02/2005  Job:  [id number]

## 2011-02-17 NOTE — H&P (Signed)
[name], [name]            ACCOUNT NO.:  [id number]   MEDICAL RECORD NO.:  [id number]          PATIENT TYPE:  AMB   LOCATION:  NESC                         FACILITY:  [facility]   PHYSICIAN:  Juan H. Lily Peer, M.D.DATE OF BIRTH:  June 07, 1961   DATE OF ADMISSION:  03/02/2005  DATE OF DISCHARGE:                                HISTORY & PHYSICAL   The patient is scheduled for surgery tomorrow, March 02, 2005, at [facility].  Surgery is scheduled for 7:30 a.m.   CHIEF COMPLAINT:  1. Dysfunction uterine bleeding.  2. Ovarian cyst.     HISTORY:  The patient is a 50 year old, gravida 1, para 1, who was seen in  the office on Feb 24, 2005, for a preoperative consultation.  The patient  had been having a mild discharge for which she was treated for a yeast  infection.  Review of her records revealed that she had a stage I tubular  carcinoma of her left breast, status post lumpectomy in the later part of  2003.  She is currently on Tamoxifen since February 2004.  Due to the fact  that she has been on Tamoxifen, an endometrial biopsy had been done in  February 2006 and because of her dysfunction uterine bleeding.  Fragments of  endometrial polyp had been noted but no hyperplasia or malignancy was  identified.  She had a sonohysterogram as part of her workup which  demonstrated her uterus was normal size with an endometrial stripe of 10.5-  mm, and her right ovary had an ovarian cyst that measured 23 x 14 x 21-mm.  It was avascular with a __________  . The left ovary measuring 1.2 x 0.8 x  0.9-cm with a thin wall echo free cyst measuring 2.3 x 2.6 x 2.9-cm with a  thin septation, also avascular.  No fluid in the cul-de-sac.  The patient  has been anxious because of her history of breast cancer.  She has had a  history of cervical conization and cryotherapy for dysplasia in the past and  wanted to have definitive surgery, so a laparoscopic assisted vaginal  hysterectomy with  bilateral salpingo-oophorectomy was recommended.   PHYSICAL EXAMINATION:  GENERAL:  She is well-developed, well-nourished white  female.  HEENT:  Unremarkable.  NECK:  Supple.  Trachea midline.  No carotid bruits.  No thyromegaly.  LUNGS:  Clear to auscultation without rhonchi or wheezes.  HEART:  Regular rate and rhythm with no murmurs or gallops.  BREAST:  Not done, due to the fact that in January 2006, it had been  completed.  ABDOMEN:  Soft and nontender.  No rebound or guarding.  PELVIC:  Bartholin, urethral, and Skene within normal limits.  Vagina and  cervix, no gross lesions.  The uterus is anteverted, normal size, shape, and  consistency.  Adnexa was unremarkable.  RECTAL:  Not done.   PAST MEDICAL HISTORY:  1. One normal spontaneous vaginal delivery.  2. History of cervical dysplasia.  3. History of LEEP cervical cone.  4. History of cryotherapy.  5. History of left breast lumpectomy (stage I tubular carcinoma).  MEDICATIONS:  Calcium supplementation with vitamin D.  The patient is on  Tamoxifen and Wellbutrin.   ALLERGIES:  She denies.   ASSESSMENT:  A patient with:  1. Bilateral ovarian cysts.  2. Dysfunctional uterine bleeding.  3. History of left breast stage I tubular carcinoma, on Tamoxifen.   We had a lengthy discussion today about the risks, benefits, and pros and  cons of laparoscopic assisted vaginal hysterectomy and she had previously  been provided with literature information on such a procedure.  We discussed  the potential risks of infection.  She will receive prophylactic  antibiotics.  The risks for DVT and pulmonary embolism were discussed.  She  will have PSA stockings for prophylaxis as well.  Also in the event of any  technical difficulty in gaining access to the abdominal/pelvic cavities, we  may need to proceed with an open laparotomy approach to complete the  operation, also in the event of any trauma to internal organs such as the   bladder, intestines, blood vessels, or any other internal organs.  The  patient is fully aware of the above.  She was aware that her vasomotor  symptoms could potentiate at the time of having ovaries removed.  She had  been doing well on Wellbutrin 150 mg every day.  She denies any allergies.  Her pre-op labs will consist of CBC, quantitative HCG, urinalysis as well.  All questions were answered and will follow accordingly.   PLAN:  The patient is scheduled for laparoscopic assisted vaginal  hysterectomy with bilateral salpingo-oophorectomy on Thursday, March 02, 2005  at 7:30 a.m. at [facility]      JHF/MEDQ  D:  03/01/2005  T:  03/01/2005  Job:  [id number]

## 2011-02-17 NOTE — Op Note (Signed)
   NAME:  [name], [name]                      ACCOUNT NO.:  [id number]   MEDICAL RECORD NO.:  [id number]                   PATIENT TYPE:  AMB   LOCATION:  DAY                                  FACILITY:  [facility]   PHYSICIAN:  Sharlet Salina T. Hoxworth, M.D.          DATE OF BIRTH:  05-06-1961   DATE OF PROCEDURE:  07/11/2002  DATE OF DISCHARGE:                                 OPERATIVE REPORT   PREOPERATIVE DIAGNOSES:  Left breast mass.   POSTOPERATIVE DIAGNOSES:  Left breast mass.   SURGICAL PROCEDURE:  Left breast biopsy.   SURGEON:  Lorne Skeens. Hoxworth, M.D.   ANESTHESIA:  Local with IV sedation.   BRIEF HISTORY:  [name] is a 50 year old white female who approximately  one year ago had a small superficial breast mass noted by her gynecologist.  Ultrasound at that time showed a benign appearing 6 mm mass consistent with  an intramammary node. Six month follow-up was stable but the next six month  follow-up just recently revealed some enlargement and excision has been  recommended and accepted. Examination shows a very superficial less than 1  cm mobile mass in the upper outer quadrant of the left breast. The nature of  the procedure, indications, risks of bleeding and infection were discussed  and understood. She was brought to the operating room for this procedure.   DESCRIPTION OF PROCEDURE:  The patient was brought to the operating room,  placed in supine position on the operating table and IV sedation was  administered. The left breast was sterilely prepped and draped. Local  anesthesia was used to infiltrate the skin and underlying breast tissue. A  small curvilinear incision was made directly over the mass and dissection  carried down into the subcutaneous tissue. The mass was grasped with an  Allis clamp, elevated and sharply excised with a small rim of surrounding  normal breast tissue. Hemostasis was obtained with the cautery. The skin was  closed with running  subcuticular 4-0 Monocryl and Steri-Strips. Sponge,  needle and instrument counts were correct. A dry sterile dressing was  applied patient taken to recovery in good condition.                                               Lorne Skeens. Hoxworth, M.D.    Tory Emerald  D:  07/11/2002  T:  07/11/2002  Job:  [id number]

## 2011-04-14 ENCOUNTER — Other Ambulatory Visit: Payer: Self-pay | Admitting: Oncology

## 2011-04-14 ENCOUNTER — Encounter (HOSPITAL_BASED_OUTPATIENT_CLINIC_OR_DEPARTMENT_OTHER): Payer: BC Managed Care – PPO | Admitting: Oncology

## 2011-04-14 DIAGNOSIS — C50419 Malignant neoplasm of upper-outer quadrant of unspecified female breast: Secondary | ICD-10-CM

## 2011-04-14 LAB — LACTATE DEHYDROGENASE: LDH: 167 U/L (ref 94–250)

## 2011-04-14 LAB — COMPREHENSIVE METABOLIC PANEL
ALT: 10 U/L (ref 0–35)
AST: 15 U/L (ref 0–37)
CO2: 28 mEq/L (ref 19–32)
Chloride: 106 mEq/L (ref 96–112)
Creatinine, Ser: 0.99 mg/dL (ref 0.50–1.10)
Glucose, Bld: 81 mg/dL (ref 70–99)
Potassium: 3.7 mEq/L (ref 3.5–5.3)
Sodium: 141 mEq/L (ref 135–145)
Total Protein: 6.6 g/dL (ref 6.0–8.3)

## 2011-04-14 LAB — CBC WITH DIFFERENTIAL/PLATELET
BASO%: 0.4 % (ref 0.0–2.0)
HCT: 37.6 % (ref 34.8–46.6)
HGB: 12.8 g/dL (ref 11.6–15.9)
LYMPH%: 25.7 % (ref 14.0–49.7)
MONO%: 6.3 % (ref 0.0–14.0)
RBC: 3.79 10*6/uL (ref 3.70–5.45)
lymph#: 2 10*3/uL (ref 0.9–3.3)

## 2011-04-14 LAB — CANCER ANTIGEN 27.29: CA 27.29: 19 U/mL (ref 0–39)

## 2011-04-21 ENCOUNTER — Encounter (HOSPITAL_BASED_OUTPATIENT_CLINIC_OR_DEPARTMENT_OTHER): Payer: BC Managed Care – PPO | Admitting: Oncology

## 2011-04-21 ENCOUNTER — Other Ambulatory Visit: Payer: Self-pay | Admitting: Oncology

## 2011-04-21 DIAGNOSIS — C50919 Malignant neoplasm of unspecified site of unspecified female breast: Secondary | ICD-10-CM

## 2011-05-01 ENCOUNTER — Other Ambulatory Visit (HOSPITAL_COMMUNITY): Payer: BC Managed Care – PPO

## 2011-05-10 ENCOUNTER — Inpatient Hospital Stay (HOSPITAL_COMMUNITY): Admission: RE | Admit: 2011-05-10 | Payer: BC Managed Care – PPO | Source: Ambulatory Visit

## 2011-05-23 ENCOUNTER — Other Ambulatory Visit: Payer: Self-pay | Admitting: Gynecology

## 2011-05-30 ENCOUNTER — Other Ambulatory Visit (HOSPITAL_COMMUNITY)
Admission: RE | Admit: 2011-05-30 | Discharge: 2011-05-30 | Disposition: A | Discharge status: Home / Self Care | Payer: BC Managed Care – PPO | Source: Ambulatory Visit | Attending: Gynecology | Admitting: Gynecology

## 2011-05-30 ENCOUNTER — Ambulatory Visit (INDEPENDENT_AMBULATORY_CARE_PROVIDER_SITE_OTHER): Payer: BC Managed Care – PPO | Admitting: Gynecology

## 2011-05-30 ENCOUNTER — Encounter: Payer: Self-pay | Admitting: Gynecology

## 2011-05-30 VITALS — BP 110/70 | Ht 64.25 in | Wt 124.0 lb

## 2011-05-30 DIAGNOSIS — M949 Disorder of cartilage, unspecified: Secondary | ICD-10-CM

## 2011-05-30 DIAGNOSIS — D071 Carcinoma in situ of vulva: Secondary | ICD-10-CM

## 2011-05-30 DIAGNOSIS — C50919 Malignant neoplasm of unspecified site of unspecified female breast: Secondary | ICD-10-CM

## 2011-05-30 DIAGNOSIS — Z01419 Encounter for gynecological examination (general) (routine) without abnormal findings: Secondary | ICD-10-CM

## 2011-05-30 DIAGNOSIS — F329 Major depressive disorder, single episode, unspecified: Secondary | ICD-10-CM

## 2011-05-30 DIAGNOSIS — M899 Disorder of bone, unspecified: Secondary | ICD-10-CM

## 2011-05-30 DIAGNOSIS — M8580 Other specified disorders of bone density and structure, unspecified site: Secondary | ICD-10-CM | POA: Insufficient documentation

## 2011-05-30 DIAGNOSIS — F32A Depression, unspecified: Secondary | ICD-10-CM | POA: Insufficient documentation

## 2011-05-30 MED ORDER — BUPROPION HCL ER (XL) 150 MG PO TB24: 150.0000 mg | ORAL_TABLET | Freq: Every day | ORAL | Status: DC

## 2011-05-30 MED ORDER — RALOXIFENE HCL 60 MG PO TABS: 60.0000 mg | ORAL_TABLET | Freq: Every day | ORAL | Status: DC

## 2011-05-30 NOTE — Progress Notes (Signed)
[name] 03-04-1961 [id number]   History:    50 y.o.  for annual exam presented to the office today for her annual gynecological examination. Patient with known history of left breast cancer has been under the care of Dr. Caron Presume who she saw last month. Patient is status post lumpectomy in November 24 at 2003 for T1b in basis tubular cancer status post completion of radiation February 2004 and previously on tamoxifen with no evidence of recurrent disease. Patient had a colonoscopy in January this year were hyperplastic polyps were noted and patient is to have a followup in 3-5 years. She has a history of an LAVH/BSO in the past. As well as vulvar carcinoma in situ. Her last mammogram was in November 2011. She does her monthly self breast examination. Her last bone density was normal last year she scheduled for 1 next year. She is on Evista 60 mg daily. She suffer from depression the past but is under control now and like to start tapering off her medication.  Past medical history,surgical history, family history and social history were all reviewed and documented in the EPIC chart. ROS:  Was performed and pertinent positives and negatives are included in the history.  Exam: chaperone present Filed Vitals:   05/30/11 1557  BP: 110/70   @WEIGHT @ Body mass index is 21.12 kg/(m^2).  General appearance : Well developed well nourished female. Skin grossly normal HEENT: Neck supple, trachea midline Lungs: Clear to auscultation, no rhonchi or wheezes Heart: Regular rate and rhythm, no murmurs or gallops Breast:Examined in sitting and supine position were symmetrical in appearance, no palpable masses, to skin retraction, no nipple inversion, no nipple discharge and no axillary or supraclavicular lymphadenopathy Abdomen: no palpable masses or tenderness Pelvic  Ext/BUS/vagina  normal   Cervix  absent  Uterus absent vagina: No gross lesions on inspection  Adnexa  Without masses or  tenderness  Anus and perineum  normal   Rectovaginal  normal sphincter tone without palpated masses or tenderness             Hemoccult not done     Assessment/Plan:  50 y.o. female for annual exam unremarkable. Will begin to taper patient's Wellbutrin from 150 mg to 75 mg daily for the next 3 months and then she will take one tablet every other day for 2-3 months and eventually taper off completely. She is encouraged to continue her calcium and vitamin D for osteoporosis prevention. She is still smoker despite the risk factors that she has and potential risk for lung cancer as well as osteoporosis were discussed. She has had history of osteopenia in the past and for this reason she is currently on Evista 60 mg daily and she was scheduled her mammogram for this November. Her Pap smear was done today. Will otherwise see her back in one year or when necessary.    Ok Edwards MD, 5:36 PM 05/30/2011

## 2011-09-07 ENCOUNTER — Encounter: Payer: Self-pay | Admitting: Gynecology

## 2011-09-27 ENCOUNTER — Telehealth: Payer: Self-pay | Admitting: *Deleted

## 2011-09-27 NOTE — Telephone Encounter (Signed)
left message to inform the patient of the new date and time of the new appointment in 2013

## 2012-01-12 ENCOUNTER — Other Ambulatory Visit: Payer: Self-pay | Admitting: Gynecology

## 2012-04-22 ENCOUNTER — Other Ambulatory Visit: Payer: BC Managed Care – PPO | Admitting: Lab

## 2012-04-26 ENCOUNTER — Ambulatory Visit: Payer: BC Managed Care – PPO | Admitting: Oncology

## 2012-04-26 NOTE — Progress Notes (Signed)
[facility] today.  Letter mailed to patient.  

## 2012-05-09 ENCOUNTER — Ambulatory Visit: Payer: BC Managed Care – PPO | Admitting: Oncology

## 2012-06-17 ENCOUNTER — Other Ambulatory Visit: Payer: Self-pay | Admitting: Gynecology

## 2012-06-17 NOTE — Telephone Encounter (Signed)
Sent [name]. A message to please try to contact patient and schedule CE since time for it.

## 2012-06-24 ENCOUNTER — Other Ambulatory Visit (HOSPITAL_COMMUNITY)
Admission: RE | Admit: 2012-06-24 | Discharge: 2012-06-24 | Disposition: A | Discharge status: Home / Self Care | Payer: BC Managed Care – PPO | Source: Ambulatory Visit | Attending: Gynecology | Admitting: Gynecology

## 2012-06-24 ENCOUNTER — Encounter: Payer: Self-pay | Admitting: Gynecology

## 2012-06-24 ENCOUNTER — Ambulatory Visit (INDEPENDENT_AMBULATORY_CARE_PROVIDER_SITE_OTHER): Payer: BC Managed Care – PPO | Admitting: Gynecology

## 2012-06-24 VITALS — BP 112/70 | Ht 64.75 in | Wt 120.0 lb

## 2012-06-24 DIAGNOSIS — Z01419 Encounter for gynecological examination (general) (routine) without abnormal findings: Secondary | ICD-10-CM | POA: Insufficient documentation

## 2012-06-24 DIAGNOSIS — E559 Vitamin D deficiency, unspecified: Secondary | ICD-10-CM

## 2012-06-24 DIAGNOSIS — Z1151 Encounter for screening for human papillomavirus (HPV): Secondary | ICD-10-CM | POA: Insufficient documentation

## 2012-06-24 DIAGNOSIS — F329 Major depressive disorder, single episode, unspecified: Secondary | ICD-10-CM

## 2012-06-24 DIAGNOSIS — N901 Moderate vulvar dysplasia: Secondary | ICD-10-CM | POA: Insufficient documentation

## 2012-06-24 DIAGNOSIS — Z0000 Encounter for general adult medical examination without abnormal findings: Secondary | ICD-10-CM

## 2012-06-24 DIAGNOSIS — F32A Depression, unspecified: Secondary | ICD-10-CM

## 2012-06-24 LAB — CBC WITH DIFFERENTIAL/PLATELET
Basophils Absolute: 0 10*3/uL (ref 0.0–0.1)
Eosinophils Relative: 1 % (ref 0–5)
Lymphocytes Relative: 28 % (ref 12–46)
Lymphs Abs: 2.6 10*3/uL (ref 0.7–4.0)
MCV: 94.9 fL (ref 78.0–100.0)
Neutro Abs: 5.9 10*3/uL (ref 1.7–7.7)
Neutrophils Relative %: 64 % (ref 43–77)
Platelets: 263 10*3/uL (ref 150–400)
RBC: 3.96 MIL/uL (ref 3.87–5.11)
RDW: 12.8 % (ref 11.5–15.5)
WBC: 9.3 10*3/uL (ref 4.0–10.5)

## 2012-06-24 MED ORDER — RALOXIFENE HCL 60 MG PO TABS: 60.0000 mg | ORAL_TABLET | Freq: Every day | ORAL | Status: DC

## 2012-06-24 MED ORDER — BUPROPION HCL ER (XL) 150 MG PO TB24: 150.0000 mg | ORAL_TABLET | Freq: Every day | ORAL | Status: DC

## 2012-06-24 NOTE — Progress Notes (Signed)
JAMILLAH CAMILO 08-07-1961 [id number]   History:    51 y.o.  for annual gyn exam who has a past history of:  Stage T1b NO invasive tubular carcinoma status post radiation therapy of left breast in February 2004. Previously on tamoxifen status post completion of 5 years  History of VIN III June of 2011 invasive disease not seen. Wide local excision 04/22/2010 VIN-III also verrucous moderate squamous dysplasia right labia majora  Patient also said history of osteopenia and has history of depression for which she's been doing well Wellbutrin. Patient has a history in the past and 2006 for laparoscopic-assisted vaginal hysterectomy and bilateral salpingo-oophorectomy. Her last bone density study was in 2011 with her lowest T score the AP spine with a value of -1.1. Patient's currently on Evista 60 mg daily. She is also taking calcium and vitamin D twice a day. 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 She smokes occasionally at times.    Past medical history,surgical history, family history and social history were all reviewed and documented in the EPIC chart.  Gynecologic History No LMP recorded. Patient has had a hysterectomy. Contraception: Hysterectomy Last Pap: 2012. Results were: normal Last mammogram: 2012. Results were: normal  Obstetric History OB History    Grav Para Term Preterm Abortions TAB SAB Ect Mult Living   1 1 1       1      # Outc Date GA Lbr Len/2nd Wgt Sex Del Anes PTL Lv   1 TRM     F SVD  No Yes       ROS: A ROS was performed and pertinent positives and negatives are included in the history.  GENERAL: No fevers or chills. HEENT: No change in vision, no earache, sore throat or sinus congestion. NECK: No pain or stiffness. CARDIOVASCULAR: No chest pain or pressure. No palpitations. PULMONARY: No shortness of breath, cough or wheeze. GASTROINTESTINAL: No abdominal pain, nausea, vomiting or diarrhea, melena or bright red blood per  rectum. GENITOURINARY: No urinary frequency, urgency, hesitancy or dysuria. MUSCULOSKELETAL: No joint or muscle pain, no back pain, no recent trauma. DERMATOLOGIC: No rash, no itching, no lesions. ENDOCRINE: No polyuria, polydipsia, no heat or cold intolerance. No recent change in weight. HEMATOLOGICAL: No anemia or easy bruising or bleeding. NEUROLOGIC: No headache, seizures, numbness, tingling or weakness. PSYCHIATRIC: No depression, no loss of interest in normal activity or change in sleep pattern.     Exam: chaperone present  BP 112/70  Ht 5' 4.75" (1.645 m)  Wt 120 lb (54.432 kg)  BMI 20.12 kg/m2  Body mass index is 20.12 kg/(m^2).  General appearance : Well developed well nourished female. No acute distress HEENT: Neck supple, trachea midline, no carotid bruits, no thyroidmegaly Lungs: Clear to auscultation, no rhonchi or wheezes, or rib retractions  Heart: Regular rate and rhythm, no murmurs or gallops Breast:Examined in sitting and supine position were symmetrical in appearance, no palpable masses or tenderness,  no skin retraction, no nipple inversion, no nipple discharge, no skin discoloration, no axillary or supraclavicular lymphadenopathy Abdomen: no palpable masses or tenderness, no rebound or guarding Extremities: no edema or skin discoloration or tenderness  Pelvic:  Bartholin, Urethra, Skene Glands: Within normal limits             Vagina: Area of the fourchette a small leukoplakic area versus scarring? Cervix: Absent  Uterus absent Adnexa  Without masses or tenderness  Anus and perineum  normal   Rectovaginal  normal sphincter tone without palpated masses or tenderness             Hemoccult cards provided for the patient to submit to the office for testing     Assessment/Plan:  51 y.o. female for annual exam doing well several years out from her breast cancer and completion of tamoxifen. Patient with history of VIN 3 wide local excision without invasion in 2011 of the  fourchette along with history of verrucous moderate dysplastic lesion on the right labia majora which was excised. On exam today a small leukoplakic area versus scarring of the fourchette was noted. Patient will return back next week for colposcopic evaluation and possible biopsy to rule out recurrence. She did have a Pap smear today and will continue to do so yearly despite the new guidelines because of her results of vulvar dysplasia. She was encouraged to continue to do her monthly self breast examination she will schedule  her mammogram and bone density study in December this year. She was reminded to submit to the office Hemoccult cards for testing. We discussed importance of calcium and vitamin D for osteoporosis prevention as well. The following labs were were today: CBC, cholesterol, vitamin D level, TSH, random blood sugar, and urinalysis. She was given literature information on the Tdap vaccine and is going to check her immunization records.    Ok Edwards MD, 4:59 PM 06/24/2012

## 2012-06-24 NOTE — Patient Instructions (Addendum)
Diphtheria, Tetanus, and Pertussis (DTaP) Vaccine What You Need to Know WHY GET VACCINATED? Diphtheria, tetanus, and pertussis are serious diseases caused by bacteria. Diphtheria and pertussis are spread from person to person. Tetanus enters the body through cuts or wounds. Diphtheria causes a thick covering in the back of the throat.  It can lead to breathing problems, paralysis, heart failure, and even death.  Tetanus (Lockjaw) causes painful tightening of the muscles, usually all over the body.  It can lead to "locking" of the jaw so the victim cannot open his or her mouth or swallow. Tetanus leads to death in about 2 out of 10 cases.  Pertussis (Whooping Cough) causes coughing spells so bad that it is hard for infants to eat, drink, or breathe. These spells can last for weeks.  It can lead to pneumonia, seizures (jerking and staring spells), brain damage, and death.  Diphtheria, tetanus, and pertussis vaccine (DTaP) can help prevent these diseases. Most children who are vaccinated with DTaP will be protected throughout childhood. Many more children would get these diseases if we stopped vaccinating. DTaP is a safer version of an older vaccine called DTP. DTP is no longer used in the [place]. WHO SHOULD GET DTAP VACCINE AND WHEN? Children should get 5 doses of DTaP vaccine, 1 dose at each of the following ages:  2 months.   4 months.   6 months.   15 to 18 months.   4 to 6 years.  DTaP may be given at the same time as other vaccines. SOME CHILDREN SHOULD NOT GET DTAP VACCINE OR SHOULD WAIT  Children with minor illnesses, such as a cold, may be vaccinated. But children who are moderately or severely ill should usually wait until they recover before getting DTaP vaccine.   Any child who had a life-threatening allergic reaction after a dose of DTaP should not get another dose.   Any child who suffered a brain or nervous system disease within 7 days after a dose of DTaP should  not get another dose.   Talk with your caregiver if your child:   Had a seizure or collapsed after a dose of DTaP.   Cried non-stop for 3 hours or more after a dose of DTaP.   Had a fever over 105 F (40.6 C) after a dose of DTaP.   Ask your caregiver for more information. Some of these children should not get another dose of pertussis vaccine, but may get a vaccine without pertussis, called DT.  OLDER CHILDREN AND ADULTS  DTaP is not licensed for adolescents, adults, or children 1 years of age and older.   But older people still need protection. A vaccine called Tdap is similar to DTaP. A single dose of Tdap is recommended for people 11 through 53 years of age. Another vaccine, called Td, protects against tetanus and diphtheria, but not pertussis. It is recommended every 10 years.  WHAT ARE THE RISKS FROM DTAP VACCINE?  Getting diphtheria, tetanus, or pertussis disease is much riskier than getting DTaP vaccine.   However, a vaccine, like any medicine, is capable of causing serious problems, such as severe allergic reactions. The risk of DTaP vaccine causing serious harm, or death, is extremely small.  Mild Problems (Common)  Fever (up to about 1 child in 4).   Redness or swelling where the shot was given (up to about 1 child in 4).   Soreness or tenderness where the shot was given (up to about 1 child in 4).  These problems occur more often after the 4th and 5th doses of the DTaP series than after earlier doses. Sometimes the 4th or 5th dose of DTaP vaccine is followed by swelling of the entire arm or leg in which the shot was given, lasting 1 to 7 days (up to about 1 child in 30). Other mild problems include:  Fussiness (up to about 1 child in 3).   Tiredness or poor appetite (up to about 1 child in 10).   Vomiting (up to about 1 child in 50).  These problems generally occur 1 to 3 days after the shot. Moderate Problems (Uncommon)  Seizure (jerking or staring) (about 1 child  out of 14,000).   Non-stop crying, for 3 hours or more (up to about 1 child out of 1,000).   High fever, over 105 F (40.6 C) (about 1 child out of 16,000).  Severe Problems (Very Rare)  Serious allergic reaction (less than 1 out of a million doses).   Several other severe problems have been reported after DTaP vaccine. These include:   Long-term seizures, coma, or lowered consciousness.   Permanent brain damage.  These are so rare it is hard to tell if they are caused by the vaccine. Controlling fever is especially important for children who have had seizures, for any reason. It is also important if another family member has had seizures. You can reduce fever and pain by giving your child an aspirin-free pain reliever when the shot is given, and for the next 24 hours, following the package instructions. WHAT IF THERE IS A MODERATE OR SEVERE REACTION? What should I look for? Any unusual conditions, such as a serious allergic reaction, high fever, or unusual behavior. Serious allergic reactions are extremely rare with any vaccine. If one were to occur, it would most likely be within a few minutes to a few hours after the shot. Signs can include difficulty breathing, hoarseness or wheezing, hives, paleness, weakness, a fast heartbeat, or dizziness. If a high fever or seizure were to occur, it would usually be within a week after the shot. What should I do?  Call your caregiver or get the person to a caregiver right away.   Tell the caregiver what happened, the date and time it happened, and when the vaccination was given.   Ask the caregiver, nurse, or health department to file a Vaccine Adverse Event Reporting System (VAERS) form. Or, you can file this report through the VAERS website at www.vaers.LAgents.no or by calling 1-(563)628-9859.  VAERS does not provide medical advice. THE NATIONAL VACCINE INJURY COMPENSATION PROGRAM  In the rare event that you or your child has a serious reaction  to a vaccine, a federal program has been created to help you pay for the care of those who have been harmed.   For details about the National Vaccine Injury Compensation Program, call 315-830-0253 or visit the program's website at SpiritualWord.at  HOW CAN I LEARN MORE?  Ask your caregiver. They can give you the vaccine package insert or suggest other sources of information.   Call your local or state health department's immunization program.   Contact the Centers for Disease Control and Prevention (CDC):   Call 778-515-9892 (1-800-CDC-INFO).   Visit the [employer] at PicCapture.uy  CDC Diphtheria, Tetanus, and Pertussis (DTaP) Vaccine VIS (02/15/06) Document Released: 07/16/2006 Document Revised: 09/07/2011 Document Reviewed: 07/16/2006 [facility] Patient Information 2012 [place].

## 2012-06-25 LAB — URINALYSIS W MICROSCOPIC + REFLEX CULTURE
Casts: NONE SEEN
Hgb urine dipstick: NEGATIVE
Ketones, ur: NEGATIVE mg/dL
Leukocytes, UA: NEGATIVE
Nitrite: NEGATIVE
Protein, ur: NEGATIVE mg/dL
Urobilinogen, UA: 0.2 mg/dL (ref 0.0–1.0)
pH: 6.5 (ref 5.0–8.0)

## 2012-06-25 LAB — CHOLESTEROL, TOTAL: Cholesterol: 183 mg/dL (ref 0–200)

## 2012-06-25 LAB — TSH: TSH: 1.169 u[IU]/mL (ref 0.350–4.500)

## 2012-06-25 LAB — GLUCOSE, RANDOM: Glucose, Bld: 77 mg/dL (ref 70–99)

## 2012-06-28 ENCOUNTER — Encounter: Payer: Self-pay | Admitting: Gynecology

## 2012-07-04 ENCOUNTER — Encounter: Payer: Self-pay | Admitting: Gynecology

## 2012-07-04 ENCOUNTER — Ambulatory Visit (INDEPENDENT_AMBULATORY_CARE_PROVIDER_SITE_OTHER): Payer: BC Managed Care – PPO | Admitting: Gynecology

## 2012-07-04 VITALS — BP 110/64

## 2012-07-04 DIAGNOSIS — N9089 Other specified noninflammatory disorders of vulva and perineum: Secondary | ICD-10-CM

## 2012-07-04 DIAGNOSIS — Z23 Encounter for immunization: Secondary | ICD-10-CM

## 2012-07-04 NOTE — Progress Notes (Signed)
51 y.o. 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 Previously on tamoxifen status post completion of 5 years   History of VIN III June of 2011 invasive disease not seen. Wide local excision 04/22/2010 VIN-III also verrucous moderate squamous dysplasia right labia majora.  During this exam she was noted to have what appears to be a leukoplakic area versus scarring at the area of the fourchette where she had  a wide local excision and was instructed to come to the office today for colposcopic evaluation as follows:  Physical Exam  Genitourinary:      patient underwent a detail colposcopic evaluation the external genitalia after applying acetic acid demonstrated the small island like leukoplakic area. The area was cleansed with Betadine solution and a keypunch biopsy was obtained and submitted for histological evaluation. Silver nitrate was used for hemostasis. The rest of the external genitalia perineum and perirectal region with no lesions. Also colposcopic exam of the vagina and vaginal cuff with no lesion seen. We'll wait for pathology report to rule out any recurrence of vulvar dysplasia.

## 2012-07-18 ENCOUNTER — Encounter: Payer: Self-pay | Admitting: Gynecology

## 2012-07-18 ENCOUNTER — Other Ambulatory Visit: Payer: Self-pay | Admitting: Gynecology

## 2012-07-18 ENCOUNTER — Ambulatory Visit (INDEPENDENT_AMBULATORY_CARE_PROVIDER_SITE_OTHER): Payer: BC Managed Care – PPO | Admitting: Gynecology

## 2012-07-18 VITALS — BP 116/70

## 2012-07-18 DIAGNOSIS — D071 Carcinoma in situ of vulva: Secondary | ICD-10-CM

## 2012-07-18 DIAGNOSIS — N901 Moderate vulvar dysplasia: Secondary | ICD-10-CM

## 2012-07-18 NOTE — Patient Instructions (Addendum)
[name] will call you with day of surgery, location and time

## 2012-07-18 NOTE — Progress Notes (Addendum)
[name] is an 51 y.o. female who presented to the office to discuss her recent vulvar pathology results as well as for preoperative consultation. Review of her record as follows:  Patient was seen for her annual exam with 06/24/2012. During that examination she was noted to have a white leukoplakic area on the inferior portion of her left labia majora. 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 Previously on tamoxifen status post completion of 5 years  History of VIN III June of 2011 invasive disease not seen. Wide local excision 04/22/2010 VIN-III also verrucous moderate squamous dysplasia right labia majora  On October 3 patient underwent a detail colposcopic evaluation the external genitalia after applying acetic acid demonstrated the [address] like leukoplakic area. The area was cleansed with Betadine solution and a keypunch biopsy was obtained and submitted for histological evaluation.  Pathology report demonstrated the following: Diagnosis Vulva, biopsy, right labia majora - HIGH GRADE VULVAR INTRAEPITHELIAL NEOPLASIA, VIN-II / VIN-III. Jimmy Picket MD **Correction pathology was notified that the biopsy site was from the left labia majora and a correction was made in a dated pathology report.   Patient will be scheduled for wide local excision of the inferior portion of the left labia majora for VIN II VIN-III     Pertinent Gynecological History: Menses: Bleeding: Post menopausal Contraception: none DES exposure: denies Blood transfusions: none Sexually transmitted diseases: no past history Previous GYN Procedures: See above  Last mammogram: normal Date: 2012 Last pap: normal Date: 2012 OB History: G 1, P 1   Menstrual History: Menarche age: 81 No LMP recorded. Patient has had a hysterectomy.    Past Medical History  Diagnosis Date  . S/P radiation therapy  09/2002---11/2002    RADIATION OF RIGHT BREAST  . Hyperplastic colon polyp 11/02/10  . Depression   . S/P vasectomy     HUSBAND HAS HAD VASECTOMY  . Dysplasia     CERVICAL    Past Surgical History  Procedure Date  . Cervical biopsy  w/ loop electrode excision     LEEP CONE  . Gynecologic cryosurgery   . Breast surgery     STAGE 1 TUBULAR CA LEFT BREAST  . Vaginal hysterectomy     LAVH,BSO  . Abdominal surgery     LAPAROTOMY, SBO,LYSIS OF ADHESIONS  . Wide local excision forchette 04/22/2000    DR. FERNANDEZ-SURGEON--VULVAR CARCINOMA INSITU  . Vin iii     Forchette/Wide local excision  . Vin ii     right labia majora  (verruca with moderate dysplasia)    History reviewed. No pertinent family history.  Social History:  reports that she has been smoking.  She has never used smokeless tobacco. She reports that she does not drink alcohol or use illicit drugs.  Allergies: No Known Allergies   (Not in a hospital admission)  REVIEW OF SYSTEMS: A ROS was performed and pertinent positives and negatives are included in the history.  GENERAL: No fevers or chills. HEENT: No change in vision, no earache, sore throat or sinus congestion. NECK: No pain or stiffness. CARDIOVASCULAR: No chest pain or pressure. No palpitations. PULMONARY: No shortness of breath, cough or wheeze. GASTROINTESTINAL: No abdominal pain, nausea, vomiting or diarrhea, melena or bright red blood per rectum. GENITOURINARY: No urinary frequency, urgency, hesitancy or dysuria. MUSCULOSKELETAL: No joint or muscle pain, no back pain, no recent trauma. DERMATOLOGIC: No rash, no itching,  no lesions. ENDOCRINE: No polyuria, polydipsia, no heat or cold intolerance. No recent change in weight. HEMATOLOGICAL: No anemia or easy bruising or bleeding. NEUROLOGIC: No headache, seizures, numbness, tingling or weakness. PSYCHIATRIC: No depression, no loss of interest in normal activity or change in sleep pattern.     Blood pressure  116/70.  Physical Exam:  HEENT:unremarkable Neck:Supple, midline, no thyroid megaly, no carotid bruits Lungs:  Clear to auscultation no rhonchi's or wheezes Heart:Regular rate and rhythm, no murmurs or gallops Breast Exam: Unremarkable on recent annual exam Abdomen: Soft nontender no rebound or guarding Pelvic:BUS as described above Vagina: As described above Cervix: No lesions or discharge Uterus: Anteverted Adnexa: No masses or tenderness Extremities: No cords, no edema Rectal: Unremarkable  No results found for this or any previous visit (from the past 24 hour(s)).  No results found.  Assessment/Plan: Patient with evidence of recurrent VIN 2 VIN-III from different location from previous wide local excision of the fourchette. This area now is in the inferior portion of the left labia majora. Patient will be scheduled to undergo wide local excision of the area in an outpatient surgical setting. All the risk were outlined with the patient as follows:                        Patient was counseled as to the risk of surgery to include the following:  1. Infection (prohylactic antibiotics will be administered)  2. DVT/Pulmonary Embolism (prophylactic pneumo compression stockings will be used)  3.Trauma to internal organs requiring additional surgical procedure to repair any injury to     Internal organs requiring perhaps additional hospitalization days.  4.Hemmorhage requiring transfusion and blood products which carry risks such as  anaphylactic reaction, hepatitis and AIDS  Patient had received literature information on the procedure scheduled and all her questions were answered [facility] HMD4:18 PMTD@   FERNANDEZ,JUAN H 07/18/2012, 4:07 PM

## 2012-08-18 ENCOUNTER — Other Ambulatory Visit: Payer: Self-pay | Admitting: Gynecology

## 2012-08-19 ENCOUNTER — Encounter (HOSPITAL_BASED_OUTPATIENT_CLINIC_OR_DEPARTMENT_OTHER): Payer: Self-pay | Admitting: *Deleted

## 2012-08-19 NOTE — Progress Notes (Signed)
NPO AFTER MN. ARRIVES AT 0600. NEEDS HG. PRE-OP ORDERS PENDING, DR Lily Peer NOTIFIED VIA KATHY AT OFFICE.

## 2012-08-22 NOTE — H&P (Signed)
[name] is an 51 y.o. female who presented to the office to discuss her recent vulvar pathology results as well as for preoperative consultation. Review of her record as follows:  Patient was seen for her annual exam with 06/24/2012. During that examination she was noted to have a white leukoplakic area on the inferior portion of her left labia majora. 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 Previously on tamoxifen status post completion of 5 years  History of VIN III June of 2011 invasive disease not seen. Wide local excision 04/22/2010 VIN-III also verrucous moderate squamous dysplasia right labia majora  On October 3 patient underwent a detail colposcopic evaluation the external genitalia after applying acetic acid demonstrated the [address] like leukoplakic area. The area was cleansed with Betadine solution and a keypunch biopsy was obtained and submitted for histological evaluation.  Pathology report demonstrated the following:  Diagnosis  Vulva, biopsy, right labia majora  - HIGH GRADE VULVAR INTRAEPITHELIAL NEOPLASIA, VIN-II / VIN-III.  Jimmy Picket MD  **Correction pathology was notified that the biopsy site was from the left labia majora and a correction was made in a dated pathology report.  Patient will be scheduled for wide local excision of the inferior portion of the left labia majora for VIN II VIN-III  Pertinent Gynecological History:  Menses: Bleeding: Post menopausal  Contraception: none  DES exposure: denies  Blood transfusions: none  Sexually transmitted diseases: no past history  Previous GYN Procedures: See above  Last mammogram: normal Date: 2012  Last pap: normal Date: 2012  OB History: G 1, P 1  Menstrual History:  Menarche age: 73  No LMP recorded. Patient has had a hysterectomy.   Past Medical History   Diagnosis  Date   .  S/P radiation therapy   09/2002---11/2002     RADIATION OF RIGHT BREAST   .  Hyperplastic colon polyp  11/02/10   .  Depression    .  S/P vasectomy      HUSBAND HAS HAD VASECTOMY   .  Dysplasia      CERVICAL    Past Surgical History   Procedure  Date   .  Cervical biopsy w/ loop electrode excision      LEEP CONE   .  Gynecologic cryosurgery    .  Breast surgery      STAGE 1 TUBULAR CA LEFT BREAST   .  Vaginal hysterectomy      LAVH,BSO   .  Abdominal surgery      LAPAROTOMY, SBO,LYSIS OF ADHESIONS   .  Wide local excision forchette  04/22/2000     DR. Tava Peery-SURGEON--VULVAR CARCINOMA INSITU   .  Vin iii      Forchette/Wide local excision   .  Vin ii      right labia majora (verruca with moderate dysplasia)    History reviewed. No pertinent family history.  Social History: reports that she has been smoking. She has never used smokeless tobacco. She reports that she does not drink alcohol or use illicit drugs.  Allergies: No Known Allergies   (Not in a hospital admission)  REVIEW OF SYSTEMS: A ROS was performed and pertinent positives and negatives are included in the history.  GENERAL: No fevers or chills. HEENT: No change in vision, no earache, sore throat or sinus congestion. NECK: No pain or stiffness. CARDIOVASCULAR: No chest pain or pressure. No palpitations.  PULMONARY: No shortness of breath, cough or wheeze. GASTROINTESTINAL: No abdominal pain, nausea, vomiting or diarrhea, melena or bright red blood per rectum. GENITOURINARY: No urinary frequency, urgency, hesitancy or dysuria. MUSCULOSKELETAL: No joint or muscle pain, no back pain, no recent trauma. DERMATOLOGIC: No rash, no itching, no lesions. ENDOCRINE: No polyuria, polydipsia, no heat or cold intolerance. No recent change in weight. HEMATOLOGICAL: No anemia or easy bruising or bleeding. NEUROLOGIC: No headache, seizures, numbness, tingling or weakness. PSYCHIATRIC: No depression, no loss of interest in normal activity or change in sleep  pattern.  Blood pressure 116/70.  Physical Exam:  HEENT:unremarkable  Neck:Supple, midline, no thyroid megaly, no carotid bruits  Lungs: Clear to auscultation no rhonchi's or wheezes  Heart:Regular rate and rhythm, no murmurs or gallops  Breast Exam: Unremarkable on recent annual exam  Abdomen: Soft nontender no rebound or guarding  Pelvic:BUS as described above  Vagina: As described above  Cervix: No lesions or discharge  Uterus: Anteverted  Adnexa: No masses or tenderness  Extremities: No cords, no edema  Rectal: Unremarkable  No results found for this or any previous visit (from the past 24 hour(s)).  No results found.  Assessment/Plan:  Patient with evidence of recurrent VIN 2 VIN-III from different location from previous wide local excision of the fourchette. This area now is in the inferior portion of the left labia majora. Patient will be scheduled to undergo wide local excision of the area in an outpatient surgical setting. All the risk were outlined with the patient as follows:  Patient was counseled as to the risk of surgery to include the following:  1. Infection (prohylactic antibiotics will be administered)  2. DVT/Pulmonary Embolism (prophylactic pneumo compression stockings will be used)  3.Trauma to internal organs requiring additional surgical procedure to repair any injury to  Internal organs requiring perhaps additional hospitalization days.  4.Hemmorhage requiring transfusion and blood products which carry risks such as anaphylactic reaction, hepatitis and AIDS  Patient had received literature information on the procedure scheduled and all her questions were answered [facility] HMD4:18 PMTD@

## 2012-08-23 ENCOUNTER — Encounter (HOSPITAL_BASED_OUTPATIENT_CLINIC_OR_DEPARTMENT_OTHER): Payer: Self-pay | Admitting: Anesthesiology

## 2012-08-23 ENCOUNTER — Ambulatory Visit (HOSPITAL_BASED_OUTPATIENT_CLINIC_OR_DEPARTMENT_OTHER)
Admission: RE | Admit: 2012-08-23 | Discharge: 2012-08-23 | Disposition: A | Discharge status: Home / Self Care | Payer: BC Managed Care – PPO | Source: Ambulatory Visit | Attending: Gynecology | Admitting: Gynecology

## 2012-08-23 ENCOUNTER — Encounter (HOSPITAL_BASED_OUTPATIENT_CLINIC_OR_DEPARTMENT_OTHER)
Admission: RE | Disposition: A | Discharge status: Home / Self Care | Payer: Self-pay | Source: Ambulatory Visit | Attending: Gynecology

## 2012-08-23 ENCOUNTER — Ambulatory Visit (HOSPITAL_BASED_OUTPATIENT_CLINIC_OR_DEPARTMENT_OTHER): Payer: BC Managed Care – PPO | Admitting: Anesthesiology

## 2012-08-23 ENCOUNTER — Encounter (HOSPITAL_BASED_OUTPATIENT_CLINIC_OR_DEPARTMENT_OTHER): Payer: Self-pay | Admitting: *Deleted

## 2012-08-23 DIAGNOSIS — D071 Carcinoma in situ of vulva: Secondary | ICD-10-CM

## 2012-08-23 DIAGNOSIS — N901 Moderate vulvar dysplasia: Secondary | ICD-10-CM

## 2012-08-23 HISTORY — PX: VULVECTOMY: SHX1086

## 2012-08-23 HISTORY — DX: Carcinoma in situ of vulva: D07.1

## 2012-08-23 HISTORY — DX: Personal history of malignant neoplasm of breast: Z85.3

## 2012-08-23 LAB — CBC
HCT: 40.7 % (ref 36.0–46.0)
MCHC: 33.7 g/dL (ref 30.0–36.0)
Platelets: 301 10*3/uL (ref 150–400)
RDW: 13.5 % (ref 11.5–15.5)
WBC: 7.6 10*3/uL (ref 4.0–10.5)

## 2012-08-23 LAB — URINALYSIS, ROUTINE W REFLEX MICROSCOPIC
Bilirubin Urine: NEGATIVE
Nitrite: NEGATIVE
Protein, ur: NEGATIVE mg/dL
Urobilinogen, UA: 1 mg/dL (ref 0.0–1.0)

## 2012-08-23 LAB — URINE MICROSCOPIC-ADD ON

## 2012-08-23 SURGERY — WIDE EXCISION VULVECTOMY
Anesthesia: General | Site: Vulva | Wound class: Clean Contaminated

## 2012-08-23 MED ORDER — CEFAZOLIN SODIUM-DEXTROSE 2-3 GM-% IV SOLR
2.0000 g | INTRAVENOUS | Status: AC
  Administered 2012-08-23: 2 g via INTRAVENOUS
  Filled 2012-08-23: qty 50

## 2012-08-23 MED ORDER — IODINE STRONG (LUGOLS) 5 % PO SOLN
ORAL | Status: DC | PRN
  Administered 2012-08-23: 2 mL

## 2012-08-23 MED ORDER — LIDOCAINE-EPINEPHRINE 2 %-1:100000 IJ SOLN
INTRAMUSCULAR | Status: DC | PRN
  Administered 2012-08-23: 10 mL

## 2012-08-23 MED ORDER — LIDOCAINE HCL (CARDIAC) 20 MG/ML IV SOLN
INTRAVENOUS | Status: DC | PRN
  Administered 2012-08-23: 75 mg via INTRAVENOUS

## 2012-08-23 MED ORDER — LACTATED RINGERS IV SOLN
INTRAVENOUS | Status: DC
  Filled 2012-08-23: qty 1000

## 2012-08-23 MED ORDER — MORPHINE SULFATE 2 MG/ML IJ SOLN
1.0000 mg | INTRAMUSCULAR | Status: DC | PRN
  Filled 2012-08-23: qty 1

## 2012-08-23 MED ORDER — METOCLOPRAMIDE HCL 10 MG PO TABS: 10.0000 mg | ORAL_TABLET | Freq: Three times a day (TID) | ORAL | Status: DC

## 2012-08-23 MED ORDER — ONDANSETRON HCL 4 MG/2ML IJ SOLN
INTRAMUSCULAR | Status: DC | PRN
  Administered 2012-08-23: 4 mg via INTRAVENOUS

## 2012-08-23 MED ORDER — DEXAMETHASONE SODIUM PHOSPHATE 4 MG/ML IJ SOLN
INTRAMUSCULAR | Status: DC | PRN
  Administered 2012-08-23: 10 mg via INTRAVENOUS

## 2012-08-23 MED ORDER — LACTATED RINGERS IV SOLN
INTRAVENOUS | Status: DC
  Administered 2012-08-23 (×3): via INTRAVENOUS
  Filled 2012-08-23: qty 1000

## 2012-08-23 MED ORDER — FENTANYL CITRATE 0.05 MG/ML IJ SOLN
INTRAMUSCULAR | Status: DC | PRN
  Administered 2012-08-23 (×2): 50 ug via INTRAVENOUS
  Administered 2012-08-23 (×2): 25 ug via INTRAVENOUS
  Administered 2012-08-23: 50 ug via INTRAVENOUS

## 2012-08-23 MED ORDER — MIDAZOLAM HCL 5 MG/5ML IJ SOLN
INTRAMUSCULAR | Status: DC | PRN
  Administered 2012-08-23 (×2): 1 mg via INTRAVENOUS

## 2012-08-23 MED ORDER — PROPOFOL 10 MG/ML IV BOLUS
INTRAVENOUS | Status: DC | PRN
  Administered 2012-08-23: 200 mg via INTRAVENOUS

## 2012-08-23 MED ORDER — SILVER SULFADIAZINE 1 % EX CREA
TOPICAL_CREAM | CUTANEOUS | Status: DC | PRN
  Administered 2012-08-23: 1 via TOPICAL

## 2012-08-23 MED ORDER — PROMETHAZINE HCL 25 MG/ML IJ SOLN
6.2500 mg | INTRAMUSCULAR | Status: DC | PRN
  Filled 2012-08-23: qty 1

## 2012-08-23 MED ORDER — GLYCOPYRROLATE 0.2 MG/ML IJ SOLN
INTRAMUSCULAR | Status: DC | PRN
  Administered 2012-08-23: 0.2 mg via INTRAVENOUS

## 2012-08-23 MED ORDER — HYDROCODONE-ACETAMINOPHEN 5-500 MG PO TABS: 1.0000 | ORAL_TABLET | Freq: Four times a day (QID) | ORAL | Status: DC | PRN

## 2012-08-23 MED ORDER — KETOROLAC TROMETHAMINE 30 MG/ML IJ SOLN
INTRAMUSCULAR | Status: DC | PRN
  Administered 2012-08-23: 30 mg via INTRAVENOUS

## 2012-08-23 SURGICAL SUPPLY — 31 items
BLADE SURG 15 STRL LF DISP TIS (BLADE) ×1 IMPLANT
BLADE SURG 15 STRL SS (BLADE) ×1
CANISTER SUCTION 2500CC (MISCELLANEOUS) ×2 IMPLANT
CLOTH BEACON ORANGE TIMEOUT ST (SAFETY) ×2 IMPLANT
COVER TABLE BACK 60X90 (DRAPES) ×2 IMPLANT
DRAPE LG THREE QUARTER DISP (DRAPES) ×2 IMPLANT
DRAPE UNDERBUTTOCKS STRL (DRAPE) ×2 IMPLANT
ELECT NEEDLE TIP 2.8 STRL (NEEDLE) ×2 IMPLANT
ELECT REM PT RETURN 9FT ADLT (ELECTROSURGICAL) ×2
ELECTRODE REM PT RTRN 9FT ADLT (ELECTROSURGICAL) ×1 IMPLANT
GOWN STRL REIN XL XLG (GOWN DISPOSABLE) ×2 IMPLANT
LEGGING LITHOTOMY PAIR STRL (DRAPES) ×2 IMPLANT
NEEDLE HYPO 22GX1.5 SAFETY (NEEDLE) ×2 IMPLANT
NS IRRIG 500ML POUR BTL (IV SOLUTION) ×2 IMPLANT
PACK BASIN DAY SURGERY FS (CUSTOM PROCEDURE TRAY) ×2 IMPLANT
PAD OB MATERNITY 4.3X12.25 (PERSONAL CARE ITEMS) ×2 IMPLANT
PAD PREP 24X48 CUFFED NSTRL (MISCELLANEOUS) ×2 IMPLANT
PENCIL BUTTON HOLSTER BLD 10FT (ELECTRODE) ×2 IMPLANT
SCOPETTES 8  STERILE (MISCELLANEOUS) ×2
SCOPETTES 8 STERILE (MISCELLANEOUS) ×2 IMPLANT
SUT MON AB 2-0 SH 27 (SUTURE) ×1
SUT MON AB 2-0 SH27 (SUTURE) ×1 IMPLANT
SUT VIC AB 2-0 PS2 27 (SUTURE) ×4 IMPLANT
SUT VIC AB 3-0 CT1 27 (SUTURE)
SUT VIC AB 3-0 CT1 27XBRD (SUTURE) IMPLANT
SYR CONTROL 10ML LL (SYRINGE) ×2 IMPLANT
SYRINGE IRR TOOMEY STRL 70CC (SYRINGE) IMPLANT
TOWEL OR 17X24 6PK STRL BLUE (TOWEL DISPOSABLE) ×4 IMPLANT
TRAY DSU PREP LF (CUSTOM PROCEDURE TRAY) ×2 IMPLANT
TUBE CONNECTING 12X1/4 (SUCTIONS) ×2 IMPLANT
YANKAUER SUCT BULB TIP NO VENT (SUCTIONS) ×2 IMPLANT

## 2012-08-23 NOTE — Transfer of Care (Signed)
Immediate Anesthesia Transfer of Care Note  Patient: [name]  Procedure(s) Performed: Procedure(s) (LRB): WIDE EXCISION VULVECTOMY (N/A)  Patient Location: Patient transported to PACU with oxygen via face mask at 4 Liters / Min  Anesthesia Type: General  Level of Consciousness: awake and alert   Airway & Oxygen Therapy: Patient Spontanous Breathing and Patient connected to face mask oxygen  Post-op Assessment: Report given to PACU RN and Post -op Vital signs reviewed and stable  Post vital signs: Reviewed and stable  Dentition: Teeth and oropharynx remain in pre-op condition  Complications: No apparent anesthesia complications

## 2012-08-23 NOTE — Anesthesia Preprocedure Evaluation (Signed)
Anesthesia Evaluation  Patient identified by MRN, date of birth, ID band Patient awake    Reviewed: Allergy & Precautions, H&P , NPO status , Patient's Chart, lab work & pertinent test results  Airway Mallampati: II TM Distance: >3 FB Neck ROM: Full    Dental  (+) Teeth Intact, Caps and Dental Advisory Given   Pulmonary Current Smoker,  breath sounds clear to auscultation        Cardiovascular negative cardio ROS  Rhythm:Regular Rate:Normal     Neuro/Psych Depression negative neurological ROS     GI/Hepatic negative GI ROS, Neg liver ROS,   Endo/Other  negative endocrine ROS  Renal/GU negative Renal ROS  negative genitourinary   Musculoskeletal negative musculoskeletal ROS (+)   Abdominal   Peds negative pediatric ROS (+)  Hematology negative hematology ROS (+)   Anesthesia Other Findings   Reproductive/Obstetrics Breast CA                           Anesthesia Physical Anesthesia Plan  ASA: II  Anesthesia Plan: General   Post-op Pain Management:    Induction: Intravenous  Airway Management Planned: LMA  Additional Equipment:   Intra-op Plan:   Post-operative Plan: Extubation in OR  Informed Consent: I have reviewed the patients History and Physical, chart, labs and discussed the procedure including the risks, benefits and alternatives for the proposed anesthesia with the patient or authorized representative who has indicated his/her understanding and acceptance.   Dental advisory given  Plan Discussed with: CRNA  Anesthesia Plan Comments:         Anesthesia Quick Evaluation

## 2012-08-23 NOTE — Interval H&P Note (Signed)
History and Physical Interval Note:  08/23/2012 7:10 AM  [name]  has presented today for surgery, with the diagnosis of VULVAR INTRAEPITHELIAL NEOPLASIA III  The various methods of treatment have been discussed with the patient and family. After consideration of risks, benefits and other options for treatment, the patient has consented to  Procedure(s) (LRB) with comments: WIDE EXCISION VULVECTOMY (N/A) - WIDE LOCAL EXCISION OF LABIA MAJORA  REQUESTS ONE HOUR OR TIME as a surgical intervention .  The patient's history has been reviewed, patient examined, no change in status, stable for surgery.  I have reviewed the patient's chart and labs.  Questions were answered to the patient's satisfaction.     Ok Edwards

## 2012-08-23 NOTE — Anesthesia Procedure Notes (Signed)
Procedure Name: LMA Insertion Date/Time: 08/23/2012 7:34 AM Performed by: Fran Lowes Pre-anesthesia Checklist: Patient identified, Emergency Drugs available, Suction available and Patient being monitored Patient Re-evaluated:Patient Re-evaluated prior to inductionOxygen Delivery Method: Circle System Utilized Preoxygenation: Pre-oxygenation with 100% oxygen Intubation Type: IV induction Ventilation: Mask ventilation without difficulty LMA: LMA inserted LMA Size: 4.0 Number of attempts: 1 Airway Equipment and Method: bite block Placement Confirmation: positive ETCO2 Tube secured with: Tape Dental Injury: Teeth and Oropharynx as per pre-operative assessment

## 2012-08-23 NOTE — Op Note (Signed)
08/23/2012  8:47 AM  PATIENT:  [name]  51 y.o. female  PRE-OPERATIVE DIAGNOSIS:  VULVAR INTRAEPITHELIAL NEOPLASIA III left inferior portion labia majora  POST-OPERATIVE DIAGNOSIS:  VULVAR INTRAEPITHELIAL NEOPLASIA III left inferior portion of labia majora  PROCEDURE:  Procedure(s): WIDE LOCAL EXCISION OF LEFT INFERIOR PORTION OF THE LABIA MAJORA  SURGEON:  Surgeon(s): Ok Edwards, MD  ANESTHESIA:   general  FINDINGS  preop pathology report:: Diagnosis Vulva, biopsy, LEFT labia majora - HIGH GRADE VULVAR INTRAEPITHELIAL NEOPLASIA, VIN-II / VIN-III   DESCRIPTION OF OPERATION: The patient was taken to the operating room where she underwent successful general endotracheal anesthesia. A time out was undertaken to identify the patient in the appropriate procedure to be undertaken. Patient received 2 g of Ancef IV. She had PAS stockings for DVT prophylaxis. Her legs were then placed in the high lithotomy position. The colposcope was brought into view. The external genitalia was inspected. The area that had previously been biopsied was identified in left inferior portion of labia majora were VIN 2/CIN-3 had been diagnosed pathologically. Lugol solution was then applied to the area and the area with poor intake of the dye was noted. With sterile marking pen the area for the wide local excision of the left inferior portion of the labia majora was undertaken. An elliptical incision was made allowing wide margins. The specimen was marked and placed on the cord board with proper identification and submitted for pathological evaluation. The subcutaneous tissue was reapproximated with a running stitch of 2-0 Vicryl suture. The skin edges were reapproximated with interrupted sutures of 3-0 Monocryl. For postop analgesia 2% lidocaine with 1 100,000 epinephrine was infiltrated for a total of 10 cc. Silvadene cream was then applied to the area. Patient was extubated transferred to recovery stable  vital signs. She received Toradol 30 mg IV. Grossly  ESTIMATED BLOOD LOSS: Minimal  Intake/Output Summary (Last 24 hours) at 08/23/12 0847 Last data filed at 08/23/12 1610  Gross per 24 hour  Intake   1300 ml  Output      0 ml  Net   1300 ml     BLOOD ADMINISTERED:none   LOCAL MEDICATIONS USED:  XYLOCAINE 2% with 1 100,000 epinephrine subdermally for postoperative analgesia total 10 cc  SPECIMEN:  Source of Specimen:  Wide local excision of left inferior labia majora  DISPOSITION OF SPECIMEN:  PATHOLOGY  COUNTS:  YES  PLAN OF CARE: Transfer to PACU  [facility] HMD8:47 AMTD@

## 2012-08-24 NOTE — Anesthesia Postprocedure Evaluation (Signed)
Anesthesia Post Note  Patient: [name]  Procedure(s) Performed: Procedure(s) (LRB): WIDE EXCISION VULVECTOMY (N/A)  Anesthesia type: General  Patient location: PACU  Post pain: Pain level controlled  Post assessment: Post-op Vital signs reviewed  Last Vitals:  Filed Vitals:   08/23/12 0955  BP: 128/77  Pulse: 66  Temp: 36.3 C  Resp: 20    Post vital signs: Reviewed  Level of consciousness: sedated  Complications: No apparent anesthesia complications

## 2012-08-26 ENCOUNTER — Encounter (HOSPITAL_BASED_OUTPATIENT_CLINIC_OR_DEPARTMENT_OTHER): Payer: Self-pay | Admitting: Gynecology

## 2012-09-13 ENCOUNTER — Other Ambulatory Visit: Payer: Self-pay | Admitting: Gynecology

## 2012-09-13 ENCOUNTER — Ambulatory Visit (INDEPENDENT_AMBULATORY_CARE_PROVIDER_SITE_OTHER): Payer: BC Managed Care – PPO | Admitting: Gynecology

## 2012-09-13 ENCOUNTER — Encounter: Payer: Self-pay | Admitting: Gynecology

## 2012-09-13 VITALS — BP 104/70

## 2012-09-13 DIAGNOSIS — Z9889 Other specified postprocedural states: Secondary | ICD-10-CM

## 2012-09-13 DIAGNOSIS — N901 Moderate vulvar dysplasia: Secondary | ICD-10-CM

## 2012-09-13 MED ORDER — FLUCONAZOLE 100 MG PO TABS: ORAL_TABLET | ORAL | Status: DC

## 2012-09-13 MED ORDER — DOXYCYCLINE HYCLATE 100 MG PO CAPS: 100.0000 mg | ORAL_CAPSULE | Freq: Two times a day (BID) | ORAL | Status: DC

## 2012-09-13 MED ORDER — NYSTATIN-TRIAMCINOLONE 100000-0.1 UNIT/GM-% EX CREA: TOPICAL_CREAM | Freq: Three times a day (TID) | CUTANEOUS | Status: DC

## 2012-09-13 NOTE — Patient Instructions (Addendum)
Sitz bath and irrigate area daily  Apply neosporin every evening on the edges only  Take vibramycin one tablet twice a day for 2 weeks

## 2012-09-13 NOTE — Progress Notes (Signed)
Patient presented to the office for 3 weeks postop visit. Patient status post wide local excision of left inferior portion of the labia majora as a result of preoperative colposcopic directed biopsy which demonstrated VIN 3 left inferior portion of the labia majora. Pathology report from the wide local excision as follows:  Diagnosis Vulva, excision, left - HIGH GRADE VULVAR INTRAEPITHELIAL NEOPLASM (VIN-II). - THE SURGICAL RESECTION MARGINS ARE NEGATIVE FOR SQUAMOUS DYSPLASIA.  Exam: Inferior portion of the left labia majora sutures of separated. The sutures were removed the area was debrided with hydrogen peroxide the fibrinous material was excised.  Assessment/plan: Patient 3 weeks postop status post wide local excision of inferior portion of the left labia majora VIN-II margins clear. Patient will be placed prophylactically on Vibramycin 100 mg twice a day for 2 weeks for MRSA prophylaxis. She will do local debridement twice a day and apply Neosporin on the edges of the incision. She will return back to the office in 4 weeks for followup.

## 2012-09-27 ENCOUNTER — Encounter: Payer: Self-pay | Admitting: Gynecology

## 2012-10-03 ENCOUNTER — Encounter: Payer: Self-pay | Admitting: Gynecology

## 2012-10-11 ENCOUNTER — Ambulatory Visit (INDEPENDENT_AMBULATORY_CARE_PROVIDER_SITE_OTHER): Payer: BC Managed Care – PPO | Admitting: Gynecology

## 2012-10-11 ENCOUNTER — Encounter: Payer: Self-pay | Admitting: Gynecology

## 2012-10-11 VITALS — BP 118/70

## 2012-10-11 DIAGNOSIS — Z9889 Other specified postprocedural states: Secondary | ICD-10-CM

## 2012-10-11 DIAGNOSIS — Z23 Encounter for immunization: Secondary | ICD-10-CM

## 2012-10-11 NOTE — Progress Notes (Signed)
Patient presented to the office for her six-week postop visit.  Patient status post wide local excision of left inferior portion of the labia majora as a result of preoperative colposcopic directed biopsy which demonstrated VIN 3 left inferior portion of the labia majora. Pathology report from the wide local excision as follows:    Diagnosis  Vulva, excision, left  - HIGH GRADE VULVAR INTRAEPITHELIAL NEOPLASM (VIN-II).  - THE SURGICAL RESECTION MARGINS ARE NEGATIVE FOR SQUAMOUS DYSPLASIA.  Exam: The inferior portion of the left labia majora completely healed. No gross evidence of any other lesions at the present time. Speculum examination did not demonstrate any visible gross lesions.  Patient's recent mammogram was normal her bone density study also was normal.  Patient will return to the office at the end of  the year for her annual gynecological examination she may resume all normal activity. Patient was requesting have the flu vaccine today which was administered.

## 2012-10-11 NOTE — Patient Instructions (Addendum)
Inactivated Influenza Vaccine What You Need to Know WHY GET VACCINATED?  Influenza ("flu") is a contagious disease.  It is caused by the influenza virus, which can be spread by coughing, sneezing, or nasal secretions.  Anyone can get influenza, but rates of infection are highest among children. For most people, symptoms last only a few days. They include:  Fever or chills.  Sore throat.  Muscle aches.  Fatigue.  Cough.  Headache.  Runny or stuffy nose. Other illnesses can have the same symptoms and are often mistaken for influenza. Young children, people 65 and older, pregnant women, and people with certain health conditions, such as heart, lung or kidney disease, or a weakened immune system can get much sicker. Flu can cause high fever and pneumonia, and make existing medical conditions worse. It can cause diarrhea and seizures in children. Each year thousands of people die from influenza and even more require hospitalization. By getting flu vaccine, you can protect yourself from influenza and may also avoid spreading influenza to others. INACTIVATED INFLUENZA VACCINE There are 2 types of influenza vaccine:  Inactivated (killed) vaccine, the "flu shot", is given by injection with a needle.  Live, attenuated (weakened) influenza vaccine is sprayed into the nostrils. This vaccine is described in a separate Vaccine Information Statement. A "high-dose" inactivated influenza vaccine is available for people 65 years of age and older. Ask your doctor for more information.  Influenza viruses are always changing, so annual vaccination is recommended. Each year scientists try to match the viruses in the vaccine to those most likely to cause flu that year. Flu vaccine will not prevent disease from other viruses, including flu viruses not contained in the vaccine. It takes up to 2 weeks for protection to develop after the shot. Protection lasts about 1 year. Some inactivated influenza vaccine  contains a preservative called thimerosal. Thimerosal-free influenza vaccine is available. Ask your doctor for more information. WHO SHOULD GET INACTIVATED INFLUENZA VACCINE AND WHEN? WHO  All people 6 months of age and older should get flu vaccine.  Vaccination is especially important for people at higher risk of severe influenza and their close contacts, including healthcare personnel and close contacts of children younger than 6 months. WHEN Getting the vaccine as soon as it is available. This should provide protection if the flu season comes early. You can get the vaccine as long as illness is occurring in your community. Influenza can occur at any time, but most influenza occurs from October through May. In recent seasons, most infections have occurred in January and February. Getting vaccinated in December, or even later, will still be beneficial in most years. Adults and older children need 1 dose of influenza vaccine each year. But some children younger than 9 years of age need 2 doses to be protected. Ask your doctor. Influenza vaccine may be given at the same time as other vaccines, including pneumococcal vaccine. SOME PEOPLE SHOULD NOT GET INACTIVATED INFLUENZA VACCINE OR SHOULD WAIT  Tell your doctor if you have any severe (life-threatening) allergies, including a severe allergy to eggs. A severe allergy to any vaccine component may be a reason not to get the vaccine. Allergic reactions to influenza vaccine are rare.  Tell your doctor if you ever had a severe reaction after a dose of influenza vaccine.  Tell your doctor if you ever had Guillain-Barr syndrome (a severe paralytic illness, also called GBS). Your doctor will help you decide whether the vaccine is recommended for you.  People who are   moderately or severely ill should usually wait until they recover before getting the flu vaccine. If you are ill, talk to your doctor about whether to reschedule the vaccination. People with  a mild illness can usually get the vaccine. WHAT ARE THE RISKS FROM INACTIVATED INFLUENZA VACCINE? A vaccine, like any medicine, could possibly cause serious problems, such as severe allergic reactions. The risk of a vaccine causing serious harm, or death, is extremely small. Serious problems from inactivated influenza vaccine are very rare. The viruses in inactivated influenza vaccine have been killed, so you cannot get influenza from the vaccine. Mild problems:  Soreness, redness, or swelling where the shot was given.  Hoarseness; sore, red or itchy eyes; cough.  Fever.  Aches.  Headache.  Itching.  Fatigue. If these problems occur, they usually begin soon after the shot and last 1 to 2 days. Moderate problems: Young children who get inactivated flu vaccine and pneumococcal vaccine (PCV13) at the same time appear to be at increased risk for seizures caused by fever. Ask your doctor for more information. Tell your doctor if a child who is getting flu vaccine has ever had a seizure. Severe problems:  Life-threatening allergic reactions from vaccines are very rare. If they do occur, it is usually within a few minutes to a few hours after the shot.  In 1976, a type of inactivated influenza (swine flu) vaccine was associated with Guillan-Barr syndrome (GBS). Since then, flu vaccines have not been clearly linked to GBS. However, if there is a risk of GBS from current flu vaccines, it would be no more than 1 or 2 cases per million people vaccinated. This is much lower than the risk of severe influenza, which can be prevented by vaccination. The safety of vaccines is always being monitored. For more information, visit:  www.cdc.gov/vaccinesafety/Vaccine_Monitoring/Index.html and  www.cdc.gov/vaccinesafety/Activities/Activities_Index.html One brand of inactivated flu vaccine, called Afluria, should not be given to children 8 years of age or younger, except in special circumstances. A  related vaccine was associated with fevers and fever-related seizures in young children in [place]. Your doctor can give you more information. WHAT IF THERE IS A SEVERE REACTION? What should I look for? Any unusual condition, such as a high fever or unusual behavior. Signs of a serious allergic reaction can include difficulty breathing, hoarseness or wheezing, hives, paleness, weakness, a fast heartbeat, or dizziness. What should I do?  Call a doctor, or get the person to a doctor right away.  Tell your doctor what happened, the date and time it happened, and when the vaccination was given.  Ask your doctor, nurse, or health department to report the reaction by filing a Vaccine Adverse Event Reporting System (VAERS) form. Or, you can file this report through the VAERS website at www.vaers.hhs.gov or by calling 1-800-822-7967. VAERS does not provide medical advice. THE NATIONAL VACCINE INJURY COMPENSATION PROGRAM The National Vaccine Injury Compensation Program (VICP) was created in 1986. People who believe they may have been injured by a vaccine can learn about the program and about filing a claim by calling 1-800-338-2382, or visiting the VICP website at www.hrsa.gov/vaccinecompensation HOW CAN I LEARN MORE?  Ask your doctor. They can give you the vaccine package insert or suggest other sources of information.  Call your local or state health department.  Contact the Centers for Disease Control and Prevention (CDC):  Call 1-800-232-4636 (1-800-CDC-INFO) or  Visit the CDC's website at www.cdc.gov/flu CDC Inactivated Influenza Vaccine VIS (04/03/11) Document Released: 07/13/2006 Document Revised: 03/19/2012 Document Reviewed:   04/03/2011 [employer] Patient Information 2013 [employer].  

## 2012-10-30 ENCOUNTER — Ambulatory Visit (INDEPENDENT_AMBULATORY_CARE_PROVIDER_SITE_OTHER): Payer: BC Managed Care – PPO | Admitting: Women's Health

## 2012-10-30 ENCOUNTER — Encounter: Payer: Self-pay | Admitting: Women's Health

## 2012-10-30 DIAGNOSIS — N899 Noninflammatory disorder of vagina, unspecified: Secondary | ICD-10-CM

## 2012-10-30 DIAGNOSIS — N898 Other specified noninflammatory disorders of vagina: Secondary | ICD-10-CM

## 2012-10-30 LAB — WET PREP FOR TRICH, YEAST, CLUE
Clue Cells Wet Prep HPF POC: NONE SEEN
Trich, Wet Prep: NONE SEEN

## 2012-10-30 MED ORDER — FLUCONAZOLE 150 MG PO TABS: 150.0000 mg | ORAL_TABLET | Freq: Once | ORAL | Status: DC

## 2012-10-30 MED ORDER — NYSTATIN-TRIAMCINOLONE 100000-0.1 UNIT/GM-% EX CREA: TOPICAL_CREAM | Freq: Three times a day (TID) | CUTANEOUS | Status: DC

## 2012-10-30 NOTE — Progress Notes (Signed)
Patient ID: [name], female   DOB: 05-17-1961, 52 y.o.   MRN: [id number] Presents with the complaint of external vaginal itching and burning. History of a wide excision of VAIN 2 with margins free 08/23/12. States area is healing well, took Diflucan yesterday and used Monistat externally. Denies abdominal pain, fever, pain with urination. Has not been sexually active recently.  Exam: External genitalia on left  labia extremely erythematous. Wet prep done with a Q-tip. Wet prep negative.  Symptomatic yeast VAIN 11 wide excision healing  Plan: Diflucan 150 mg by mouth x1 dose if continues with external itching. Avoid Monistat. Mycolog ointment externally, instructed to call if no relief of symptoms in 2 days. Continue to wear loose clothes, keep open to air as best she can.

## 2013-03-16 ENCOUNTER — Other Ambulatory Visit: Payer: Self-pay | Admitting: Gynecology

## 2013-08-07 ENCOUNTER — Other Ambulatory Visit: Payer: Self-pay

## 2013-08-19 ENCOUNTER — Encounter: Payer: Self-pay | Admitting: Gynecology

## 2013-09-05 ENCOUNTER — Encounter: Payer: Self-pay | Admitting: Gynecology

## 2013-09-05 ENCOUNTER — Other Ambulatory Visit (HOSPITAL_COMMUNITY)
Admission: RE | Admit: 2013-09-05 | Discharge: 2013-09-05 | Disposition: A | Discharge status: Home / Self Care | Payer: BC Managed Care – PPO | Source: Ambulatory Visit | Attending: Gynecology | Admitting: Gynecology

## 2013-09-05 ENCOUNTER — Ambulatory Visit (INDEPENDENT_AMBULATORY_CARE_PROVIDER_SITE_OTHER): Payer: BC Managed Care – PPO | Admitting: Gynecology

## 2013-09-05 VITALS — BP 112/70 | Ht 64.0 in | Wt 124.0 lb

## 2013-09-05 DIAGNOSIS — Z01419 Encounter for gynecological examination (general) (routine) without abnormal findings: Secondary | ICD-10-CM | POA: Insufficient documentation

## 2013-09-05 DIAGNOSIS — F3289 Other specified depressive episodes: Secondary | ICD-10-CM

## 2013-09-05 DIAGNOSIS — F329 Major depressive disorder, single episode, unspecified: Secondary | ICD-10-CM

## 2013-09-05 DIAGNOSIS — F32A Depression, unspecified: Secondary | ICD-10-CM

## 2013-09-05 DIAGNOSIS — Z853 Personal history of malignant neoplasm of breast: Secondary | ICD-10-CM

## 2013-09-05 LAB — COMPREHENSIVE METABOLIC PANEL
ALT: 12 U/L (ref 0–35)
CO2: 31 mEq/L (ref 19–32)
Calcium: 9.6 mg/dL (ref 8.4–10.5)
Chloride: 104 mEq/L (ref 96–112)
Potassium: 3.9 mEq/L (ref 3.5–5.3)
Sodium: 140 mEq/L (ref 135–145)
Total Bilirubin: 0.4 mg/dL (ref 0.3–1.2)
Total Protein: 6.5 g/dL (ref 6.0–8.3)

## 2013-09-05 LAB — CBC WITH DIFFERENTIAL/PLATELET
Lymphocytes Relative: 32 % (ref 12–46)
Lymphs Abs: 2.9 10*3/uL (ref 0.7–4.0)
Neutro Abs: 5.3 10*3/uL (ref 1.7–7.7)
Neutrophils Relative %: 59 % (ref 43–77)
Platelets: 246 10*3/uL (ref 150–400)
RBC: 4.01 MIL/uL (ref 3.87–5.11)
WBC: 9.1 10*3/uL (ref 4.0–10.5)

## 2013-09-05 LAB — CHOLESTEROL, TOTAL: Cholesterol: 183 mg/dL (ref 0–200)

## 2013-09-05 MED ORDER — RALOXIFENE HCL 60 MG PO TABS: 60.0000 mg | ORAL_TABLET | Freq: Every day | ORAL | Status: DC

## 2013-09-05 MED ORDER — BUPROPION HCL ER (XL) 150 MG PO TB24: 150.0000 mg | ORAL_TABLET | Freq: Every day | ORAL | Status: DC

## 2013-09-05 NOTE — Progress Notes (Signed)
[name] 06/03/1961 [id number]   History:    52 y.o.  for annual gyn exam with no complaints today. 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 Previously on tamoxifen status post completion of 5 years  History of VIN III June of 2011 invasive disease not seen. Wide local excision 04/22/2010 VIN-III also verrucous moderate squamous dysplasia right labia majora  Patient also said history of osteopenia and has history of depression for which she's been doing well Wellbutrin. Patient has a history in the past and 2006 for laparoscopic-assisted vaginal hysterectomy and bilateral salpingo-oophorectomy. The patient is currently on Evista 60 mg daily. She is taking her calcium and vitamin D twice a day and is physically active and playing basketball. 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 She smokes an average of a half a pack of cigarettes daily.  On 07/04/2012 patient underwent a detail colposcopic evaluation the external genitalia after applying acetic acid demonstrated the [address] like leukoplakic area. The area was cleansed with Betadine solution and a keypunch biopsy was obtained and submitted for histological evaluation. (inferior portion of the left labia majora) Pathology report demonstrated the following:  Diagnosis  Vulva, biopsy, right labia majora  - HIGH GRADE VULVAR INTRAEPITHELIAL NEOPLASIA, VIN-II / VIN-III.  On 07/18/2012 patient underwent wide local excision of the inferior portion of the left labia majora where the VIN 2/VI and 3 had previously been biopsied. Pathology report as follows:  Vulva, excision, left  - HIGH GRADE VULVAR INTRAEPITHELIAL NEOPLASM (VIN-II).  - THE SURGICAL RESECTION MARGINS ARE NEGATIVE FOR SQUAMOUS DYSPLASIA.   Patient has done well since then and has no complaints today.  Past medical history,surgical history,  family history and social history were all reviewed and documented in the EPIC chart.  Gynecologic History No LMP recorded. Patient has had a hysterectomy. Contraception: status post hysterectomy Last Pap: 2013. Results were: normal Last mammogram: 2013. Results were: normal  Obstetric History OB History  Gravida Para Term Preterm AB SAB TAB Ectopic Multiple Living  1 1 1       1     # Outcome Date GA Lbr Len/2nd Weight Sex Delivery Anes PTL Lv  1 TRM     F SVD  N Y       ROS: A ROS was performed and pertinent positives and negatives are included in the history.  GENERAL: No fevers or chills. HEENT: No change in vision, no earache, sore throat or sinus congestion. NECK: No pain or stiffness. CARDIOVASCULAR: No chest pain or pressure. No palpitations. PULMONARY: No shortness of breath, cough or wheeze. GASTROINTESTINAL: No abdominal pain, nausea, vomiting or diarrhea, melena or bright red blood per rectum. GENITOURINARY: No urinary frequency, urgency, hesitancy or dysuria. MUSCULOSKELETAL: No joint or muscle pain, no back pain, no recent trauma. DERMATOLOGIC: No rash, no itching, no lesions. ENDOCRINE: No polyuria, polydipsia, no heat or cold intolerance. No recent change in weight. HEMATOLOGICAL: No anemia or easy bruising or bleeding. NEUROLOGIC: No headache, seizures, numbness, tingling or weakness. PSYCHIATRIC: No depression, no loss of interest in normal activity or change in sleep pattern.     Exam: chaperone present  BP 112/70  Ht 5\' 4"  (1.626 m)  Wt 124 lb (56.246 kg)  BMI 21.27 kg/m2  Body mass index is 21.27 kg/(m^2).  General appearance : Well developed well nourished female. No acute distress HEENT: Neck  supple, trachea midline, no carotid bruits, no thyroidmegaly Lungs: Clear to auscultation, no rhonchi or wheezes, or rib retractions  Heart: Regular rate and rhythm, no murmurs or gallops Breast:Examined in sitting and supine position were symmetrical in appearance, no  palpable masses or tenderness,  no skin retraction, no nipple inversion, no nipple discharge, no skin discoloration, no axillary or supraclavicular lymphadenopathy Abdomen: no palpable masses or tenderness, no rebound or guarding Extremities: no edema or skin discoloration or tenderness  Pelvic:  Bartholin, Urethra, Skene Glands: Within normal limits             Vagina: No gross lesions or discharge  Cervix: absent  Uterus  Absent,  Adnexa  Without masses or tenderness  Anus and perineum  normal   Rectovaginal  normal sphincter tone without palpated masses or tenderness             Hemoccult card provided   a magnification lens was applied to the external genitalia and a thorough inspection did not reveal any visible lesions.  Assessment/Plan:  52 y.o. female for annual exam with past history of VIN 2 VIN-III as well as past history of breast cancer having completed tamoxifen doing well on Evista 60 mg daily. She was reminded to continue to do her monthly self breast exam. We discussed once again the detrimental effects of smoking and information was provided for cessation. She was reminded to schedule her mammogram. Pap smear was done today. The following labs were ordered: CBC, comprehensive metabolic panel, screen cholesterol, TSH and urinalysis. She was reminded to submit to the office the Hemoccult cards for testing.  Note: This dictation was prepared with  Dragon/digital dictation along withSmart phrase technology. Any transcriptional errors that result from this process are unintentional.   Ok Edwards MD, 5:32 PM 09/05/2013

## 2013-09-05 NOTE — Patient Instructions (Signed)
Smoking Cessation Quitting smoking is important to your health and has many advantages. However, it is not always easy to quit since nicotine is a very addictive drug. Often times, people try 3 times or more before being able to quit. This document explains the best ways for you to prepare to quit smoking. Quitting takes hard work and a lot of effort, but you can do it. ADVANTAGES OF QUITTING SMOKING  You will live longer, feel better, and live better.  Your body will feel the impact of quitting smoking almost immediately.  Within 20 minutes, blood pressure decreases. Your pulse returns to its normal level.  After 8 hours, carbon monoxide levels in the blood return to normal. Your oxygen level increases.  After 24 hours, the chance of having a heart attack starts to decrease. Your breath, hair, and body stop smelling like smoke.  After 48 hours, damaged nerve endings begin to recover. Your sense of taste and smell improve.  After 72 hours, the body is virtually free of nicotine. Your bronchial tubes relax and breathing becomes easier.  After 2 to 12 weeks, lungs can hold more air. Exercise becomes easier and circulation improves.  The risk of having a heart attack, stroke, cancer, or lung disease is greatly reduced.  After 1 year, the risk of coronary heart disease is cut in half.  After 5 years, the risk of stroke falls to the same as a nonsmoker.  After 10 years, the risk of lung cancer is cut in half and the risk of other cancers decreases significantly.  After 15 years, the risk of coronary heart disease drops, usually to the level of a nonsmoker.  If you are pregnant, quitting smoking will improve your chances of having a healthy baby.  The people you live with, especially any children, will be healthier.  You will have extra money to spend on things other than cigarettes. QUESTIONS TO THINK ABOUT BEFORE ATTEMPTING TO QUIT You may want to talk about your answers with your  caregiver.  Why do you want to quit?  If you tried to quit in the past, what helped and what did not?  What will be the most difficult situations for you after you quit? How will you plan to handle them?  Who can help you through the tough times? Your family? Friends? A caregiver?  What pleasures do you get from smoking? What ways can you still get pleasure if you quit? Here are some questions to ask your caregiver:  How can you help me to be successful at quitting?  What medicine do you think would be best for me and how should I take it?  What should I do if I need more help?  What is smoking withdrawal like? How can I get information on withdrawal? GET READY  Set a quit date.  Change your environment by getting rid of all cigarettes, ashtrays, matches, and lighters in your home, car, or work. Do not let people smoke in your home.  Review your past attempts to quit. Think about what worked and what did not. GET SUPPORT AND ENCOURAGEMENT You have a better chance of being successful if you have help. You can get support in many ways.  Tell your family, friends, and co-workers that you are going to quit and need their support. Ask them not to smoke around you.  Get individual, group, or telephone counseling and support. Programs are available at local hospitals and health centers. Call your local health department for   information about programs in your area.  Spiritual beliefs and practices may help some smokers quit.  Download a "quit meter" on your computer to keep track of quit statistics, such as how long you have gone without smoking, cigarettes not smoked, and money saved.  Get a self-help book about quitting smoking and staying off of tobacco. LEARN NEW SKILLS AND BEHAVIORS  Distract yourself from urges to smoke. Talk to someone, go for a walk, or occupy your time with a task.  Change your normal routine. Take a different route to work. Drink tea instead of coffee.  Eat breakfast in a different place.  Reduce your stress. Take a hot bath, exercise, or read a book.  Plan something enjoyable to do every day. Reward yourself for not smoking.  Explore interactive web-based programs that specialize in helping you quit. GET MEDICINE AND USE IT CORRECTLY Medicines can help you stop smoking and decrease the urge to smoke. Combining medicine with the above behavioral methods and support can greatly increase your chances of successfully quitting smoking.  Nicotine replacement therapy helps deliver nicotine to your body without the negative effects and risks of smoking. Nicotine replacement therapy includes nicotine gum, lozenges, inhalers, nasal sprays, and skin patches. Some may be available over-the-counter and others require a prescription.  Antidepressant medicine helps people abstain from smoking, but how this works is unknown. This medicine is available by prescription.  Nicotinic receptor partial agonist medicine simulates the effect of nicotine in your brain. This medicine is available by prescription. Ask your caregiver for advice about which medicines to use and how to use them based on your health history. Your caregiver will tell you what side effects to look out for if you choose to be on a medicine or therapy. Carefully read the information on the package. Do not use any other product containing nicotine while using a nicotine replacement product.  RELAPSE OR DIFFICULT SITUATIONS Most relapses occur within the first 3 months after quitting. Do not be discouraged if you start smoking again. Remember, most people try several times before finally quitting. You may have symptoms of withdrawal because your body is used to nicotine. You may crave cigarettes, be irritable, feel very hungry, cough often, get headaches, or have difficulty concentrating. The withdrawal symptoms are only temporary. They are strongest when you first quit, but they will go away within  10 14 days. To reduce the chances of relapse, try to:  Avoid drinking alcohol. Drinking lowers your chances of successfully quitting.  Reduce the amount of caffeine you consume. Once you quit smoking, the amount of caffeine in your body increases and can give you symptoms, such as a rapid heartbeat, sweating, and anxiety.  Avoid smokers because they can make you want to smoke.  Do not let weight gain distract you. Many smokers will gain weight when they quit, usually less than 10 pounds. Eat a healthy diet and stay active. You can always lose the weight gained after you quit.  Find ways to improve your mood other than smoking. FOR MORE INFORMATION  www.smokefree.gov  Document Released: 09/12/2001 Document Revised: 03/19/2012 Document Reviewed: 12/28/2011 [employer] Patient Information 2014 [employer].  

## 2013-09-06 LAB — URINALYSIS W MICROSCOPIC + REFLEX CULTURE
Bacteria, UA: NONE SEEN
Bilirubin Urine: NEGATIVE
Crystals: NONE SEEN
Ketones, ur: NEGATIVE mg/dL
Specific Gravity, Urine: 1.015 (ref 1.005–1.030)
Urobilinogen, UA: 0.2 mg/dL (ref 0.0–1.0)

## 2013-09-16 ENCOUNTER — Other Ambulatory Visit: Payer: Self-pay | Admitting: Gynecology

## 2013-10-01 ENCOUNTER — Encounter: Payer: Self-pay | Admitting: Gynecology

## 2014-08-03 ENCOUNTER — Encounter: Payer: Self-pay | Admitting: Gynecology

## 2014-09-09 ENCOUNTER — Ambulatory Visit (INDEPENDENT_AMBULATORY_CARE_PROVIDER_SITE_OTHER): Payer: BC Managed Care – PPO | Admitting: Gynecology

## 2014-09-09 ENCOUNTER — Other Ambulatory Visit (HOSPITAL_COMMUNITY)
Admission: RE | Admit: 2014-09-09 | Discharge: 2014-09-09 | Disposition: A | Discharge status: Home / Self Care | Payer: BC Managed Care – PPO | Source: Ambulatory Visit | Attending: Gynecology | Admitting: Gynecology

## 2014-09-09 ENCOUNTER — Encounter: Payer: Self-pay | Admitting: Gynecology

## 2014-09-09 VITALS — BP 118/76 | Ht 64.0 in | Wt 120.0 lb

## 2014-09-09 DIAGNOSIS — Z87412 Personal history of vulvar dysplasia: Secondary | ICD-10-CM

## 2014-09-09 DIAGNOSIS — Z01419 Encounter for gynecological examination (general) (routine) without abnormal findings: Secondary | ICD-10-CM | POA: Insufficient documentation

## 2014-09-09 DIAGNOSIS — Z23 Encounter for immunization: Secondary | ICD-10-CM

## 2014-09-09 DIAGNOSIS — Z853 Personal history of malignant neoplasm of breast: Secondary | ICD-10-CM

## 2014-09-09 DIAGNOSIS — F17200 Nicotine dependence, unspecified, uncomplicated: Secondary | ICD-10-CM

## 2014-09-09 DIAGNOSIS — Z720 Tobacco use: Secondary | ICD-10-CM

## 2014-09-09 DIAGNOSIS — M8580 Other specified disorders of bone density and structure, unspecified site: Secondary | ICD-10-CM

## 2014-09-09 DIAGNOSIS — K220 Achalasia of cardia: Secondary | ICD-10-CM

## 2014-09-09 DIAGNOSIS — Z1159 Encounter for screening for other viral diseases: Secondary | ICD-10-CM

## 2014-09-09 MED ORDER — BUPROPION HCL ER (XL) 150 MG PO TB24: 150.0000 mg | ORAL_TABLET | Freq: Every day | ORAL | Status: DC

## 2014-09-09 MED ORDER — RALOXIFENE HCL 60 MG PO TABS: 60.0000 mg | ORAL_TABLET | Freq: Every day | ORAL | Status: DC

## 2014-09-09 NOTE — Patient Instructions (Addendum)
Smoking Cessation Quitting smoking is important to your health and has many advantages. However, it is not always easy to quit since nicotine is a very addictive drug. Oftentimes, people try 3 times or more before being able to quit. This document explains the best ways for you to prepare to quit smoking. Quitting takes hard work and a lot of effort, but you can do it. ADVANTAGES OF QUITTING SMOKING  You will live longer, feel better, and live better.  Your body will feel the impact of quitting smoking almost immediately.  Within 20 minutes, blood pressure decreases. Your pulse returns to its normal level.  After 8 hours, carbon monoxide levels in the blood return to normal. Your oxygen level increases.  After 24 hours, the chance of having a heart attack starts to decrease. Your breath, hair, and body stop smelling like smoke.  After 48 hours, damaged nerve endings begin to recover. Your sense of taste and smell improve.  After 72 hours, the body is virtually free of nicotine. Your bronchial tubes relax and breathing becomes easier.  After 2 to 12 weeks, lungs can hold more air. Exercise becomes easier and circulation improves.  The risk of having a heart attack, stroke, cancer, or lung disease is greatly reduced.  After 1 year, the risk of coronary heart disease is cut in half.  After 5 years, the risk of stroke falls to the same as a nonsmoker.  After 10 years, the risk of lung cancer is cut in half and the risk of other cancers decreases significantly.  After 15 years, the risk of coronary heart disease drops, usually to the level of a nonsmoker.  If you are pregnant, quitting smoking will improve your chances of having a healthy baby.  The people you live with, especially any children, will be healthier.  You will have extra money to spend on things other than cigarettes. QUESTIONS TO THINK ABOUT BEFORE ATTEMPTING TO QUIT You may want to talk about your answers with your  health care provider.  Why do you want to quit?  If you tried to quit in the past, what helped and what did not?  What will be the most difficult situations for you after you quit? How will you plan to handle them?  Who can help you through the tough times? Your family? Friends? A health care provider?  What pleasures do you get from smoking? What ways can you still get pleasure if you quit? Here are some questions to ask your health care provider:  How can you help me to be successful at quitting?  What medicine do you think would be best for me and how should I take it?  What should I do if I need more help?  What is smoking withdrawal like? How can I get information on withdrawal? GET READY  Set a quit date.  Change your environment by getting rid of all cigarettes, ashtrays, matches, and lighters in your home, car, or work. Do not let people smoke in your home.  Review your past attempts to quit. Think about what worked and what did not. GET SUPPORT AND ENCOURAGEMENT You have a better chance of being successful if you have help. You can get support in many ways.  Tell your family, friends, and coworkers that you are going to quit and need their support. Ask them not to smoke around you.  Get individual, group, or telephone counseling and support. Programs are available at local hospitals and health centers. Call   your local health department for information about programs in your area.  Spiritual beliefs and practices may help some smokers quit.  Download a "quit meter" on your computer to keep track of quit statistics, such as how long you have gone without smoking, cigarettes not smoked, and money saved.  Get a self-help book about quitting smoking and staying off tobacco. [place] yourself from urges to smoke. Talk to someone, go for a walk, or occupy your time with a task.  Change your normal routine. Take a different route to work.  Drink tea instead of coffee. Eat breakfast in a different place.  Reduce your stress. Take a hot bath, exercise, or read a book.  Plan something enjoyable to do every day. Reward yourself for not smoking.  Explore interactive web-based programs that specialize in helping you quit. GET MEDICINE AND USE IT CORRECTLY Medicines can help you stop smoking and decrease the urge to smoke. Combining medicine with the above behavioral methods and support can greatly increase your chances of successfully quitting smoking.  Nicotine replacement therapy helps deliver nicotine to your body without the negative effects and risks of smoking. Nicotine replacement therapy includes nicotine gum, lozenges, inhalers, nasal sprays, and skin patches. Some may be available over-the-counter and others require a prescription.  Antidepressant medicine helps people abstain from smoking, but how this works is unknown. This medicine is available by prescription.  Nicotinic receptor partial agonist medicine simulates the effect of nicotine in your brain. This medicine is available by prescription. Ask your health care provider for advice about which medicines to use and how to use them based on your health history. Your health care provider will tell you what side effects to look out for if you choose to be on a medicine or therapy. Carefully read the information on the package. Do not use any other product containing nicotine while using a nicotine replacement product.  RELAPSE OR DIFFICULT SITUATIONS Most relapses occur within the first 3 months after quitting. Do not be discouraged if you start smoking again. Remember, most people try several times before finally quitting. You may have symptoms of withdrawal because your body is used to nicotine. You may crave cigarettes, be irritable, feel very hungry, cough often, get headaches, or have difficulty concentrating. The withdrawal symptoms are only temporary. They are strongest  when you first quit, but they will go away within 10-14 days. To reduce the chances of relapse, try to:  Avoid drinking alcohol. Drinking lowers your chances of successfully quitting.  Reduce the amount of caffeine you consume. Once you quit smoking, the amount of caffeine in your body increases and can give you symptoms, such as a rapid heartbeat, sweating, and anxiety. Avoid smokers becauseInfluenza Virus Vaccine injection (Fluarix) What is this medicine? INFLUENZA VIRUS VACCINE (in floo EN zuh VAHY ruhs vak SEEN) helps to reduce the risk of getting influenza also known as the flu. This medicine may be used for other purposes; ask your health care provider or pharmacist if you have questions. COMMON BRAND NAME(S): Fluarix, Fluzone What should I tell my health care provider before I take this medicine? They need to know if you have any of these conditions: -bleeding disorder like hemophilia -fever or infection -Guillain-Barre syndrome or other neurological problems -immune system problems -infection with the human immunodeficiency virus (HIV) or AIDS -low blood platelet counts -multiple sclerosis -an unusual or allergic reaction to influenza virus vaccine, eggs, chicken proteins, latex, gentamicin, other medicines, foods,  dyes or preservatives -pregnant or trying to get pregnant -breast-feeding How should I use this medicine? This vaccine is for injection into a muscle. It is given by a health care professional. A copy of Vaccine Information Statements will be given before each vaccination. Read this sheet carefully each time. The sheet may change frequently. Talk to your pediatrician regarding the use of this medicine in children. Special care may be needed. Overdosage: If you think you have taken too much of this medicine contact a poison control center or emergency room at once. NOTE: This medicine is only for you. Do not share this medicine with others. What if I miss a dose? This  does not apply. What may interact with this medicine? -chemotherapy or radiation therapy -medicines that lower your immune system like etanercept, anakinra, infliximab, and adalimumab -medicines that treat or prevent blood clots like warfarin -phenytoin -steroid medicines like prednisone or cortisone -theophylline -vaccines This list may not describe all possible interactions. Give your health care provider a list of all the medicines, herbs, non-prescription drugs, or dietary supplements you use. Also tell them if you smoke, drink alcohol, or use illegal drugs. Some items may interact with your medicine. What should I watch for while using this medicine? Report any side effects that do not go away within 3 days to your doctor or health care professional. Call your health care provider if any unusual symptoms occur within 6 weeks of receiving this vaccine. You may still catch the flu, but the illness is not usually as bad. You cannot get the flu from the vaccine. The vaccine will not protect against colds or other illnesses that may cause fever. The vaccine is needed every year. What side effects may I notice from receiving this medicine? Side effects that you should report to your doctor or health care professional as soon as possible: -allergic reactions like skin rash, itching or hives, swelling of the face, lips, or tongue Side effects that usually do not require medical attention (report to your doctor or health care professional if they continue or are bothersome): -fever -headache -muscle aches and pains -pain, tenderness, redness, or swelling at site where injected -weak or tired This list may not describe all possible side effects. Call your doctor for medical advice about side effects. You may report side effects to FDA at 1-800-FDA-1088. Where should I keep my medicine? This vaccine is only given in a clinic, pharmacy, doctor's office, or other health care setting and will not be  stored at home. NOTE: This sheet is a summary. It may not cover all possible information. If you have questions about this medicine, talk to your doctor, pharmacist, or health care provider.  2015, [employer]/Gold Standard. (2008-04-15 09:30:40)   they can make you want to smoke.  Do not let weight gain distract you. Many smokers will gain weight when they quit, usually less than 10 pounds. Eat a healthy diet and stay active. You can always lose the weight gained after you quit.  Find ways to improve your mood other than smoking. FOR MORE INFORMATION  www.smokefree.gov  Document Released: 09/12/2001 Document Revised: 02/02/2014 Document Reviewed: 12/28/2011 [facility] Patient Information 2015 [place], [place]. This information is not intended to replace advice given to you by your health care provider. Make sure you discuss any questions you have with your health care provider.

## 2014-09-10 LAB — URINALYSIS W MICROSCOPIC + REFLEX CULTURE
BILIRUBIN URINE: NEGATIVE
CRYSTALS: NONE SEEN
Casts: NONE SEEN
GLUCOSE, UA: NEGATIVE mg/dL
Ketones, ur: NEGATIVE mg/dL
Nitrite: NEGATIVE
PH: 5.5 (ref 5.0–8.0)
Protein, ur: NEGATIVE mg/dL
SPECIFIC GRAVITY, URINE: 1.027 (ref 1.005–1.030)
Urobilinogen, UA: 0.2 mg/dL (ref 0.0–1.0)

## 2014-09-10 NOTE — Progress Notes (Signed)
[name] 1961/09/03 [id number]   History:    53 y.o.  for annual gyn exam with complaints of occasional difficulty swallowing.. Patient does continue to smoke. Patient is going through a difficult time with her daughter who has a drug addiction. Patient is currently on Wellbutrin herself. Her past medical history as follows:  Stage T1b NO invasive tubular carcinoma status post radiation therapy of left breast in February 2004. Previously on tamoxifen status post completion of 5 years  History of VIN III June of 2011 invasive disease not seen. Wide local excision 04/22/2010 VIN-III also verrucous moderate squamous dysplasia right labia majora  Patient also said history of osteopenia and has history of depression for which she's been doing well Wellbutrin. Patient has a history in the past and 2006 for laparoscopic-assisted vaginal hysterectomy and bilateral salpingo-oophorectomy. The patient is currently on Evista 60 mg daily. She is taking her calcium and vitamin D twice a day and is physically active and playing basketball. 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 She smokes an average of a half a pack of cigarettes daily.  On 07/04/2012 patient underwent a detail colposcopic evaluation the external genitalia after applying acetic acid demonstrated the small island like leukoplakic area. The area was cleansed with Betadine solution and a keypunch biopsy was obtained and submitted for histological evaluation. (inferior portion of the left labia majora) Pathology report demonstrated the following:   Diagnosis  Vulva, biopsy, right labia majora  - HIGH GRADE VULVAR INTRAEPITHELIAL NEOPLASIA, VIN-II / VIN-III.  On 07/18/2012 patient underwent wide local excision of the inferior portion of the left labia majora where the VIN 2/VI and 3 had previously been biopsied. Pathology report as follows:  Vulva, excision, left  - HIGH GRADE VULVAR INTRAEPITHELIAL  NEOPLASM (VIN-II).  - THE SURGICAL RESECTION MARGINS ARE NEGATIVE FOR SQUAMOUS DYSPLASIA.   Patient has done well since then and has no complaints today. Patient had a colonoscopy in 2012 she does have history of benign colon polyps removed. Patient has never had any abnormal Pap smears in the past. Her last bone density study was done in 2013 and was compared with 2011 there was no significant change her lowest T score was at the left femoral neck with a value of -1.0.   Past medical history,surgical history, family history and social history were all reviewed and documented in the EPIC chart.  Gynecologic History No LMP recorded. Patient has had a hysterectomy. Contraception: status post hysterectomy Last Pap: 2014. Results were: normal Last mammogram: 2014. Results were: normal  Obstetric History OB History  Gravida Para Term Preterm AB SAB TAB Ectopic Multiple Living  1 1 1       1     # Outcome Date GA Lbr Len/2nd Weight Sex Delivery Anes PTL Lv  1 Term     F Vag-Spont  N Y       ROS: A ROS was performed and pertinent positives and negatives are included in the history.  GENERAL: No fevers or chills. HEENT: No change in vision, no earache, sore throat or sinus congestion. NECK: No pain or stiffness. CARDIOVASCULAR: No chest pain or pressure. No palpitations. PULMONARY: No shortness of breath, cough or wheeze. GASTROINTESTINAL: No abdominal pain, nausea, vomiting or diarrhea, melena or bright red blood per rectum. GENITOURINARY: No urinary frequency, urgency, hesitancy or dysuria. MUSCULOSKELETAL: No joint or muscle pain, no back pain, no recent trauma. DERMATOLOGIC: No rash, no itching, no lesions. ENDOCRINE:  No polyuria, polydipsia, no heat or cold intolerance. No recent change in weight. HEMATOLOGICAL: No anemia or easy bruising or bleeding. NEUROLOGIC: No headache, seizures, numbness, tingling or weakness. PSYCHIATRIC: No depression, no loss of interest in normal activity or change  in sleep pattern.     Exam: chaperone present  BP 118/76 mmHg  Ht 5\' 4"  (1.626 m)  Wt 120 lb (54.432 kg)  BMI 20.59 kg/m2  Body mass index is 20.59 kg/(m^2).  General appearance : Well developed well nourished female. No acute distress HEENT: Neck supple, trachea midline, no carotid bruits, no thyroidmegaly Lungs: Clear to auscultation, no rhonchi or wheezes, or rib retractions  Heart: Regular rate and rhythm, no murmurs or gallops Breast:Examined in sitting and supine position were symmetrical in appearance, no palpable masses or tenderness,  no skin retraction, no nipple inversion, no nipple discharge, no skin discoloration, no axillary or supraclavicular lymphadenopathy Abdomen: no palpable masses or tenderness, no rebound or guarding Extremities: no edema or skin discoloration or tenderness  Pelvic:  Bartholin, Urethra, Skene Glands: Within normal limits             Vagina: No gross lesions or discharge  Cervix: Absent  Uterus  Absent  Adnexa  Without masses or tenderness  Anus and perineum  normal   Rectovaginal  normal sphincter tone without palpated masses or tenderness             Hemoccult cards provided   a magnification lens was applied to the external genitalia and a thorough inspection did not reveal any visible lesions.   Assessment/Plan:  53 y.o. female with past history of VIN 2 VIN-III as well as past history of breast cancer having completed tamoxifen doing well on Evista 60 mg daily. Patient scheduled to have her mammogram in the next few weeks. She was reminded also to schedule her bone density study as well since is overdue.She was encouraged to continue calcium vitamin D and regular exercise for osteoporosis prevention. She was also counseled again on the detrimental effects of smoking. She will continue with her Wellbutrin which has helped her with depression and anxiety. . A Pap smear was done today. The following labs ordered today: CBC, fasting lipid  profile, TSH, comprehensive metabolic panel, and urinalysis. She was reminded to make an appointment with the gas urologist since she is due for colonoscopy in also discussed about the possibility of upper endoscopy for further evaluation of her achalasia. Patient received the flu vaccine today.  New CDC guidelines is recommending patients be tested once in her lifetime for hepatitis C antibody who were born between [birth year] through 1967. This was discussed with the patient today and has agreed to be tested today.   Terrance Mass MD, 7:38 AM 09/10/2014

## 2014-09-11 ENCOUNTER — Other Ambulatory Visit: Payer: Self-pay | Admitting: Gynecology

## 2014-09-11 DIAGNOSIS — R3129 Other microscopic hematuria: Secondary | ICD-10-CM

## 2014-09-11 LAB — CYTOLOGY - PAP

## 2014-09-11 LAB — URINE CULTURE: Colony Count: 2000

## 2014-09-14 ENCOUNTER — Other Ambulatory Visit: Payer: BC Managed Care – PPO

## 2014-09-14 DIAGNOSIS — R3129 Other microscopic hematuria: Secondary | ICD-10-CM

## 2014-09-14 LAB — COMPREHENSIVE METABOLIC PANEL
ALBUMIN: 3.8 g/dL (ref 3.5–5.2)
ALT: 14 U/L (ref 0–35)
AST: 16 U/L (ref 0–37)
Alkaline Phosphatase: 62 U/L (ref 39–117)
BUN: 11 mg/dL (ref 6–23)
CALCIUM: 9.3 mg/dL (ref 8.4–10.5)
CHLORIDE: 104 meq/L (ref 96–112)
CO2: 28 meq/L (ref 19–32)
CREATININE: 0.98 mg/dL (ref 0.50–1.10)
GLUCOSE: 97 mg/dL (ref 70–99)
POTASSIUM: 4.2 meq/L (ref 3.5–5.3)
Sodium: 138 mEq/L (ref 135–145)
Total Bilirubin: 0.3 mg/dL (ref 0.2–1.2)
Total Protein: 6.3 g/dL (ref 6.0–8.3)

## 2014-09-14 LAB — LIPID PANEL
CHOL/HDL RATIO: 4.1 ratio
CHOLESTEROL: 167 mg/dL (ref 0–200)
HDL: 41 mg/dL (ref >39)
LDL Cholesterol: 109 mg/dL — ABNORMAL HIGH (ref 0–99)
TRIGLYCERIDES: 87 mg/dL (ref <150)
VLDL: 17 mg/dL (ref 0–40)

## 2014-09-14 LAB — HEPATITIS C ANTIBODY: HCV Ab: NEGATIVE

## 2014-09-14 LAB — CBC WITH DIFFERENTIAL/PLATELET
Basophils Absolute: 0.1 10*3/uL (ref 0.0–0.1)
Basophils Relative: 1 % (ref 0–1)
EOS PCT: 2 % (ref 0–5)
Eosinophils Absolute: 0.2 10*3/uL (ref 0.0–0.7)
HEMATOCRIT: 37.8 % (ref 36.0–46.0)
Hemoglobin: 12.7 g/dL (ref 12.0–15.0)
LYMPHS ABS: 2.2 10*3/uL (ref 0.7–4.0)
LYMPHS PCT: 28 % (ref 12–46)
MCH: 32.1 pg (ref 26.0–34.0)
MCHC: 33.6 g/dL (ref 30.0–36.0)
MCV: 95.5 fL (ref 78.0–100.0)
MONO ABS: 0.6 10*3/uL (ref 0.1–1.0)
MONOS PCT: 8 % (ref 3–12)
MPV: 11.9 fL (ref 9.4–12.4)
NEUTROS ABS: 4.7 10*3/uL (ref 1.7–7.7)
Neutrophils Relative %: 61 % (ref 43–77)
PLATELETS: 297 10*3/uL (ref 150–400)
RBC: 3.96 MIL/uL (ref 3.87–5.11)
RDW: 13.5 % (ref 11.5–15.5)
WBC: 7.7 10*3/uL (ref 4.0–10.5)

## 2014-09-14 LAB — TSH: TSH: 1.821 u[IU]/mL (ref 0.350–4.500)

## 2014-09-15 LAB — URINALYSIS W MICROSCOPIC + REFLEX CULTURE
BACTERIA UA: NONE SEEN
Bilirubin Urine: NEGATIVE
CASTS: NONE SEEN
CRYSTALS: NONE SEEN
Glucose, UA: NEGATIVE mg/dL
Hgb urine dipstick: NEGATIVE
KETONES UR: NEGATIVE mg/dL
Leukocytes, UA: NEGATIVE
NITRITE: NEGATIVE
PH: 7 (ref 5.0–8.0)
Protein, ur: NEGATIVE mg/dL
SQUAMOUS EPITHELIAL / LPF: NONE SEEN
Urobilinogen, UA: 0.2 mg/dL (ref 0.0–1.0)

## 2014-09-15 LAB — VITAMIN D 25 HYDROXY (VIT D DEFICIENCY, FRACTURES): Vit D, 25-Hydroxy: 36 ng/mL (ref 30–100)

## 2014-09-16 ENCOUNTER — Other Ambulatory Visit: Payer: Self-pay | Admitting: Gynecology

## 2014-09-22 ENCOUNTER — Other Ambulatory Visit: Payer: Self-pay | Admitting: Gynecology

## 2014-09-22 DIAGNOSIS — M8580 Other specified disorders of bone density and structure, unspecified site: Secondary | ICD-10-CM

## 2014-10-12 ENCOUNTER — Encounter: Payer: Self-pay | Admitting: Gynecology

## 2014-10-20 ENCOUNTER — Other Ambulatory Visit: Payer: Self-pay | Admitting: *Deleted

## 2014-10-20 DIAGNOSIS — M8580 Other specified disorders of bone density and structure, unspecified site: Secondary | ICD-10-CM

## 2015-09-10 ENCOUNTER — Other Ambulatory Visit: Payer: Self-pay | Admitting: Gynecology

## 2015-10-07 ENCOUNTER — Ambulatory Visit (HOSPITAL_COMMUNITY)
Admission: RE | Admit: 2015-10-07 | Discharge: 2015-10-07 | Disposition: A | Discharge status: Home / Self Care | Payer: Managed Care, Other (non HMO) | Source: Ambulatory Visit | Attending: Gynecology | Admitting: Gynecology

## 2015-10-07 ENCOUNTER — Encounter: Payer: Self-pay | Admitting: Gynecology

## 2015-10-07 ENCOUNTER — Ambulatory Visit (INDEPENDENT_AMBULATORY_CARE_PROVIDER_SITE_OTHER): Payer: 59 | Admitting: Gynecology

## 2015-10-07 ENCOUNTER — Other Ambulatory Visit (HOSPITAL_COMMUNITY)
Admission: RE | Admit: 2015-10-07 | Discharge: 2015-10-07 | Disposition: A | Discharge status: Home / Self Care | Payer: Managed Care, Other (non HMO) | Source: Ambulatory Visit | Attending: Gynecology | Admitting: Gynecology

## 2015-10-07 VITALS — BP 118/68 | Ht 64.0 in | Wt 126.0 lb

## 2015-10-07 DIAGNOSIS — F17200 Nicotine dependence, unspecified, uncomplicated: Secondary | ICD-10-CM

## 2015-10-07 DIAGNOSIS — Z0000 Encounter for general adult medical examination without abnormal findings: Secondary | ICD-10-CM | POA: Diagnosis present

## 2015-10-07 DIAGNOSIS — Z87412 Personal history of vulvar dysplasia: Secondary | ICD-10-CM | POA: Diagnosis not present

## 2015-10-07 DIAGNOSIS — Z720 Tobacco use: Secondary | ICD-10-CM | POA: Diagnosis not present

## 2015-10-07 DIAGNOSIS — Z853 Personal history of malignant neoplasm of breast: Secondary | ICD-10-CM

## 2015-10-07 DIAGNOSIS — M8580 Other specified disorders of bone density and structure, unspecified site: Secondary | ICD-10-CM | POA: Diagnosis not present

## 2015-10-07 DIAGNOSIS — Z23 Encounter for immunization: Secondary | ICD-10-CM | POA: Diagnosis not present

## 2015-10-07 DIAGNOSIS — Z01419 Encounter for gynecological examination (general) (routine) without abnormal findings: Secondary | ICD-10-CM | POA: Insufficient documentation

## 2015-10-07 DIAGNOSIS — Z1151 Encounter for screening for human papillomavirus (HPV): Secondary | ICD-10-CM | POA: Diagnosis present

## 2015-10-07 LAB — TSH: TSH: 2.697 u[IU]/mL (ref 0.350–4.500)

## 2015-10-07 LAB — CBC WITH DIFFERENTIAL/PLATELET
BASOS PCT: 0 % (ref 0–1)
Basophils Absolute: 0 10*3/uL (ref 0.0–0.1)
EOS ABS: 0.2 10*3/uL (ref 0.0–0.7)
Eosinophils Relative: 2 % (ref 0–5)
HCT: 39.5 % (ref 36.0–46.0)
Hemoglobin: 13.1 g/dL (ref 12.0–15.0)
Lymphocytes Relative: 29 % (ref 12–46)
Lymphs Abs: 2.5 10*3/uL (ref 0.7–4.0)
MCH: 32.3 pg (ref 26.0–34.0)
MCHC: 33.2 g/dL (ref 30.0–36.0)
MCV: 97.5 fL (ref 78.0–100.0)
MONO ABS: 0.6 10*3/uL (ref 0.1–1.0)
MONOS PCT: 7 % (ref 3–12)
MPV: 12 fL (ref 8.6–12.4)
NEUTROS PCT: 62 % (ref 43–77)
Neutro Abs: 5.3 10*3/uL (ref 1.7–7.7)
PLATELETS: 309 10*3/uL (ref 150–400)
RBC: 4.05 MIL/uL (ref 3.87–5.11)
RDW: 13.4 % (ref 11.5–15.5)
WBC: 8.5 10*3/uL (ref 4.0–10.5)

## 2015-10-07 MED ORDER — BUPROPION HCL ER (XL) 150 MG PO TB24: 150.0000 mg | ORAL_TABLET | Freq: Every day | ORAL | Status: DC

## 2015-10-07 MED ORDER — RALOXIFENE HCL 60 MG PO TABS: 60.0000 mg | ORAL_TABLET | Freq: Every day | ORAL | Status: DC

## 2015-10-07 NOTE — Addendum Note (Signed)
Addended by: Thurnell Garbe A on: 10/07/2015 12:59 PM   Modules accepted: Orders, SmartSet

## 2015-10-07 NOTE — Patient Instructions (Addendum)
Nicotine skin patches What is this medicine? NICOTINE ([place]) helps people stop smoking. The patches replace the nicotine found in cigarettes and help to decrease withdrawal effects. They are most effective when used in combination with a stop-smoking program. This medicine may be used for other purposes; ask your health care provider or pharmacist if you have questions. What should I tell my health care provider before I take this medicine? They need to know if you have any of these conditions: -diabetes -heart disease, angina, irregular heartbeat or previous heart attack -high blood pressure -lung disease, including asthma -overactive thyroid -pheochromocytoma -seizures or a history of seizures -skin problems, like eczema -stomach problems or ulcers -an unusual or allergic reaction to nicotine, adhesives, other medicines, foods, dyes, or preservatives -pregnant or trying to get pregnant -breast-feeding How should I use this medicine? This medicine is for use on the skin. Follow the directions that come with the patches. Find an area of skin on your upper arm, chest, or back that is clean, dry, greaseless, undamaged and hairless. Wash hands with plain soap and water. Do not use anything that contains aloe, lanolin or glycerin as these may prevent the patch from sticking. Dry thoroughly. Remove the patch from the sealed pouch. Do not try to cut or trim the patch. Using your palm, press the patch firmly in place for 10 seconds to make sure that there is good contact with your skin. After applying the patch, wash your hands. Change the patch every day, keeping to a regular schedule. When you apply a new patch, use a new area of skin. Wait at least 1 week before using the same area again. Talk to your pediatrician regarding the use of this medicine in children. Special care may be needed. Overdosage: If you think you have taken too much of this medicine contact a poison control center or  emergency room at once. NOTE: This medicine is only for you. Do not share this medicine with others. What if I miss a dose? If you forget to replace a patch, use it as soon as you can. Only use one patch at a time and do not leave on the skin for longer than directed. If a patch falls off, you can replace it, but keep to your schedule and remove the patch at the right time. What may interact with this medicine? -medicines for asthma -medicines for blood pressure -medicines for mental depression This list may not describe all possible interactions. Give your health care provider a list of all the medicines, herbs, non-prescription drugs, or dietary supplements you use. Also tell them if you smoke, drink alcohol, or use illegal drugs. Some items may interact with your medicine. What should I watch for while using this medicine? You should begin using the nicotine patch the day you stop smoking. It is okay if you do not succeed at your attempt to quit and have a cigarette. You can still continue your quit attempt and keep using the product as directed. Just throw away your cigarettes and get back to your quit plan. You can keep the patch in place during swimming, bathing, and showering. If your patch falls off during these activities, replace it. When you first apply the patch, your skin may itch or burn. This should go away soon. When you remove a patch, the skin may look red, but this should only last for a few days. Call your doctor or health care professional if skin redness does not go away  after 4 days, if your skin swells, or if you get a rash. If you are a diabetic and you quit smoking, the effects of insulin may be increased and you may need to reduce your insulin dose. Check with your doctor or health care professional about how you should adjust your insulin dose. If you are going to have a magnetic resonance imaging (MRI) procedure, tell your MRI technician if you have this patch on your body.  It must be removed before a MRI. What side effects may I notice from receiving this medicine? Side effects that you should report to your doctor or health care professional as soon as possible: -allergic reactions like skin rash, itching or hives, swelling of the face, lips, or tongue -breathing problems -changes in hearing -changes in vision -chest pain -cold sweats -confusion -fast, irregular heartbeat -feeling faint or lightheaded, falls -headache -increased saliva -skin redness that lasts more than 4 days -stomach pain -signs and symptoms of nicotine overdose like nausea; vomiting; dizziness; weakness; and rapid heartbeat Side effects that usually do not require medical attention (report to your doctor or health care professional if they continue or are bothersome): -diarrhea -dry mouth -hiccups -irritability -nervousness or restlessness -trouble sleeping or vivid dreams This list may not describe all possible side effects. Call your doctor for medical advice about side effects. You may report side effects to FDA at 1-800-FDA-1088. Where should I keep my medicine? Keep out of the reach of children. Store at room temperature between 20 and 25 degrees C (68 and 77 degrees F). Protect from heat and light. Store in International aid/development worker until ready to use. Throw away unused medicine after the expiration date. When you remove a patch, fold with sticky sides together; put in an empty opened pouch and throw away. NOTE: This sheet is a summary. It may not cover all possible information. If you have questions about this medicine, talk to your doctor, pharmacist, or health care provider.    2016, Elsevier/Gold Standard. (2014-08-17 15:46:21) Smoking Smoking Cessation, Tips for Success If you are ready to quit smoking, congratulations! You have chosen to help yourself be healthier. Cigarettes bring nicotine, tar, carbon monoxide, and other irritants into your body. Your lungs, heart, and  blood vessels will be able to work better without these poisons. There are many different ways to quit smoking. Nicotine gum, nicotine patches, a nicotine inhaler, or nicotine nasal spray can help with physical craving. Hypnosis, support groups, and medicines help break the habit of smoking. WHAT THINGS CAN I DO TO MAKE QUITTING EASIER?  Here are some tips to help you quit for good:  Pick a date when you will quit smoking completely. Tell all of your friends and family about your plan to quit on that date.  Do not try to slowly cut down on the number of cigarettes you are smoking. Pick a quit date and quit smoking completely starting on that day.  Throw away all cigarettes.   Clean and remove all ashtrays from your home, work, and car.  On a card, write down your reasons for quitting. Carry the card with you and read it when you get the urge to smoke.  Cleanse your body of nicotine. Drink enough water and fluids to keep your urine clear or pale yellow. Do this after quitting to flush the nicotine from your body.  Learn to predict your moods. Do not let a bad situation be your excuse to have a cigarette. Some situations in your life might  tempt you into wanting a cigarette.  Never have "just one" cigarette. It leads to wanting another and another. Remind yourself of your decision to quit.  Change habits associated with smoking. If you smoked while driving or when feeling stressed, try other activities to replace smoking. Stand up when drinking your coffee. Brush your teeth after eating. Sit in a different chair when you read the paper. Avoid alcohol while trying to quit, and try to drink fewer caffeinated beverages. Alcohol and caffeine may urge you to smoke.  Avoid foods and drinks that can trigger a desire to smoke, such as sugary or spicy foods and alcohol.  Ask people who smoke not to smoke around you.  Have something planned to do right after eating or having a cup of coffee. For  example, plan to take a walk or exercise.  Try a relaxation exercise to calm you down and decrease your stress. Remember, you may be tense and nervous for the first 2 weeks after you quit, but this will pass.  Find new activities to keep your hands busy. Play with a pen, coin, or rubber band. Doodle or draw things on paper.  Brush your teeth right after eating. This will help cut down on the craving for the taste of tobacco after meals. You can also try mouthwash.   Use oral substitutes in place of cigarettes. Try using lemon drops, carrots, cinnamon sticks, or chewing gum. Keep them handy so they are available when you have the urge to smoke.  When you have the urge to smoke, try deep breathing.  Designate your home as a nonsmoking area.  If you are a heavy smoker, ask your health care provider about a prescription for nicotine chewing gum. It can ease your withdrawal from nicotine.  Reward yourself. Set aside the cigarette money you save and buy yourself something nice.  Look for support from others. Join a support group or smoking cessation program. Ask someone at home or at work to help you with your plan to quit smoking.  Always ask yourself, "Do I need this cigarette or is this just a reflex?" Tell yourself, "Today, I choose not to smoke," or "I do not want to smoke." You are reminding yourself of your decision to quit.  Do not replace cigarette smoking with electronic cigarettes (commonly called e-cigarettes). The safety of e-cigarettes is unknown, and some may contain harmful chemicals.  If you relapse, do not give up! Plan ahead and think about what you will do the next time you get the urge to smoke. HOW WILL I FEEL WHEN I QUIT SMOKING? You may have symptoms of withdrawal because your body is used to nicotine (the addictive substance in cigarettes). You may crave cigarettes, be irritable, feel very hungry, cough often, get headaches, or have difficulty concentrating. The  withdrawal symptoms are only temporary. They are strongest when you first quit but will go away within 10-14 days. When withdrawal symptoms occur, stay in control. Think about your reasons for quitting. Remind yourself that these are signs that your body is healing and getting used to being without cigarettes. Remember that withdrawal symptoms are easier to treat than the major diseases that smoking can cause.  Even after the withdrawal is over, expect periodic urges to smoke. However, these cravings are generally short lived and will go away whether you smoke or not. Do not smoke! WHAT RESOURCES ARE AVAILABLE TO HELP ME QUIT SMOKING? Your health care provider can direct you to community resources or  hospitals for support, which may include:  Group support.  Education.  Hypnosis.  Therapy.   This information is not intended to replace advice given to you by your health care provider. Make sure you discuss any questions you have with your health care provider.   Document Released: 06/16/2004 Document Revised: 10/09/2014 Document Reviewed: 03/06/2013 Elsevier Interactive Patient Education [employer].

## 2015-10-07 NOTE — Progress Notes (Signed)
[name] 1961-06-13 [id number]   History:    55 y.o.  for annual gyn exam with no complaints today. Patient continued to smoke an average of one pack cigarette per day. She has not had a chest x-ray and quite some time. She's been a smoker for approximately 20 years. She tried Chantix before and would like to try the nicotine patch.  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 Previously on tamoxifen status post completion of 5 years  History of VIN III June of 2011 invasive disease not seen. Wide local excision 04/22/2010 VIN-III also verrucous moderate squamous dysplasia right labia majora  Patient also said history of osteopenia and has history of depression for which she's been doing well Wellbutrin. Patient has a history in the past and 2006 for laparoscopic-assisted vaginal hysterectomy and bilateral salpingo-oophorectomy. The patient is currently on Evista 60 mg daily. She is taking her calcium and vitamin D twice a day .  On 07/04/2012 patient underwent a detail colposcopic evaluation the external genitalia after applying acetic acid demonstrated the small island like leukoplakic area. The area was cleansed with Betadine solution and a keypunch biopsy was obtained and submitted for histological evaluation. (inferior portion of the left labia majora) Pathology report demonstrated the following:  Diagnosis  Vulva, biopsy, right labia majora  - HIGH GRADE VULVAR INTRAEPITHELIAL NEOPLASIA, VIN-II / VIN-III.  On 07/18/2012 patient underwent wide local excision of the inferior portion of the left labia majora where the VIN 2/VI and 3 had previously been biopsied. Pathology report as follows:  Vulva, excision, left  - HIGH GRADE VULVAR INTRAEPITHELIAL NEOPLASM (VIN-II).  - THE SURGICAL RESECTION MARGINS ARE NEGATIVE FOR SQUAMOUS DYSPLASIA.   Patient 2012 had a  colonoscopy benign polyps removed she's on a 5 year recall. Patient with no past history of any abnormal Pap smear. Her last bone density study in 2016 demonstrated bilateral femoral neck T score -1.2 and there was a statistically significant decrease in bone mineralization of the AP spine of a -7.9% when compared with 2013.  Past medical history,surgical history, family history and social history were all reviewed and documented in the EPIC chart.  Gynecologic History No LMP recorded. Patient has had a hysterectomy. Contraception: status post hysterectomy Last Pap: 2015. Results were: normal Last mammogram: 2015. Results were: normal  Obstetric History OB History  Gravida Para Term Preterm AB SAB TAB Ectopic Multiple Living  1 1 1       1     # Outcome Date GA Lbr Len/2nd Weight Sex Delivery Anes PTL Lv  1 Term     F Vag-Spont  N Y       ROS: A ROS was performed and pertinent positives and negatives are included in the history.  GENERAL: No fevers or chills. HEENT: No change in vision, no earache, sore throat or sinus congestion. NECK: No pain or stiffness. CARDIOVASCULAR: No chest pain or pressure. No palpitations. PULMONARY: No shortness of breath, cough or wheeze. GASTROINTESTINAL: No abdominal pain, nausea, vomiting or diarrhea, melena or bright red blood per rectum. GENITOURINARY: No urinary frequency, urgency, hesitancy or dysuria. MUSCULOSKELETAL: No joint or muscle pain, no back pain, no recent trauma. DERMATOLOGIC: No rash, no itching, no lesions. ENDOCRINE: No polyuria, polydipsia, no heat or cold intolerance. No recent change in weight. HEMATOLOGICAL: No anemia or easy bruising or bleeding. NEUROLOGIC: No headache, seizures, numbness, tingling or weakness. PSYCHIATRIC:  No depression, no loss of interest in normal activity or change in sleep pattern.     Exam: chaperone present  BP 118/68 mmHg  Ht 5\' 4"  (1.626 m)  Wt 126 lb (57.153 kg)  BMI 21.62 kg/m2  Body mass index is  21.62 kg/(m^2).  General appearance : Well developed well nourished female. No acute distress HEENT: Eyes: no retinal hemorrhage or exudates,  Neck supple, trachea midline, no carotid bruits, no thyroidmegaly Lungs: Clear to auscultation, no rhonchi or wheezes, or rib retractions  Heart: Regular rate and rhythm, no murmurs or gallops Breast:Examined in sitting and supine position were symmetrical in appearance, no palpable masses or tenderness,  no skin retraction, no nipple inversion, no nipple discharge, no skin discoloration, no axillary or supraclavicular lymphadenopathy Abdomen: no palpable masses or tenderness, no rebound or guarding Extremities: no edema or skin discoloration or tenderness  Pelvic:  Bartholin, Urethra, Skene Glands: Within normal limits             Vagina: No gross lesions or discharge  Cervix: Absent  Uterus  absent  Adnexa  Without masses or tenderness  Anus and perineum  normal   Rectovaginal  normal sphincter tone without palpated masses or tenderness             Hemoccult cards provided     Assessment/Plan:  55 y.o. female for annual exam with past history of VIN 2 VIN-III as well as past history of breast cancer having completed tamoxifen doing well on Evista 60 mg daily. Patient scheduled to have her mammogram in the next few weeks. She was encouraged to continue calcium vitamin D and regular exercise for osteoporosis prevention. She was also counseled again on the detrimental effects of smoking. She will be sent to [facility] hospital for. A lateral chest x-ray. Literature information on anti-smoking techniques provided. She is going to buy over-the-counter nicotine patch along with her husband. She will continue with her Wellbutrin which has helped her with depression and anxiety. . A Pap smear was done today. The following labs ordered today: CBC, fasting lipid profile, TSH, comprehensive metabolic panel, and urinalysis along with vitamin D because of her history  of osteopenia. She will return back to the office in 1-2 weeks for colposcopic evaluation external genitalia because of her past history of vulvar dysplasia in the past.   Terrance Mass MD, 12:49 PM 10/07/2015

## 2015-10-08 LAB — LIPID PANEL
CHOL/HDL RATIO: 4.1 ratio (ref <=5.0)
Cholesterol: 211 mg/dL — ABNORMAL HIGH (ref 125–200)
HDL: 51 mg/dL (ref >=46)
LDL Cholesterol: 144 mg/dL — ABNORMAL HIGH (ref <130)
TRIGLYCERIDES: 79 mg/dL (ref <150)
VLDL: 16 mg/dL (ref <30)

## 2015-10-08 LAB — VITAMIN D 25 HYDROXY (VIT D DEFICIENCY, FRACTURES): Vit D, 25-Hydroxy: 39 ng/mL (ref 30–100)

## 2015-10-08 LAB — COMPREHENSIVE METABOLIC PANEL
ALT: 11 U/L (ref 6–29)
AST: 20 U/L (ref 10–35)
Albumin: 4.2 g/dL (ref 3.6–5.1)
Alkaline Phosphatase: 60 U/L (ref 33–130)
BUN: 10 mg/dL (ref 7–25)
CHLORIDE: 104 mmol/L (ref 98–110)
CO2: 27 mmol/L (ref 20–31)
CREATININE: 0.99 mg/dL (ref 0.50–1.05)
Calcium: 9.7 mg/dL (ref 8.6–10.4)
Glucose, Bld: 75 mg/dL (ref 65–99)
Potassium: 4.1 mmol/L (ref 3.5–5.3)
SODIUM: 141 mmol/L (ref 135–146)
Total Bilirubin: 0.4 mg/dL (ref 0.2–1.2)
Total Protein: 6.7 g/dL (ref 6.1–8.1)

## 2015-10-08 LAB — URINALYSIS W MICROSCOPIC + REFLEX CULTURE
BACTERIA UA: NONE SEEN [HPF]
BILIRUBIN URINE: NEGATIVE
Casts: NONE SEEN [LPF]
Crystals: NONE SEEN [HPF]
GLUCOSE, UA: NEGATIVE
Ketones, ur: NEGATIVE
LEUKOCYTES UA: NEGATIVE
Nitrite: NEGATIVE
Protein, ur: NEGATIVE
SPECIFIC GRAVITY, URINE: 1.014 (ref 1.001–1.035)
Squamous Epithelial / LPF: NONE SEEN [HPF] (ref <=5)
WBC UA: NONE SEEN WBC/HPF (ref <=5)
Yeast: NONE SEEN [HPF]
pH: 6.5 (ref 5.0–8.0)

## 2015-10-10 ENCOUNTER — Other Ambulatory Visit: Payer: Self-pay | Admitting: Gynecology

## 2015-10-11 ENCOUNTER — Other Ambulatory Visit: Payer: Self-pay | Admitting: Gynecology

## 2015-10-11 LAB — URINE CULTURE: Colony Count: >=100000

## 2015-10-11 MED ORDER — NITROFURANTOIN MONOHYD MACRO 100 MG PO CAPS: 100.0000 mg | ORAL_CAPSULE | Freq: Two times a day (BID) | ORAL | Status: DC

## 2015-10-12 LAB — CYTOLOGY - PAP

## 2015-10-15 ENCOUNTER — Encounter: Payer: Self-pay | Admitting: Gynecology

## 2015-10-26 ENCOUNTER — Ambulatory Visit: Payer: 59 | Admitting: Gynecology

## 2015-11-12 ENCOUNTER — Ambulatory Visit: Payer: 59 | Admitting: Gynecology

## 2015-11-16 ENCOUNTER — Ambulatory Visit (INDEPENDENT_AMBULATORY_CARE_PROVIDER_SITE_OTHER): Payer: 59 | Admitting: Gynecology

## 2015-11-16 ENCOUNTER — Encounter: Payer: Self-pay | Admitting: Gynecology

## 2015-11-16 VITALS — BP 112/78

## 2015-11-16 DIAGNOSIS — Z87412 Personal history of vulvar dysplasia: Secondary | ICD-10-CM | POA: Diagnosis not present

## 2015-11-16 DIAGNOSIS — N3000 Acute cystitis without hematuria: Secondary | ICD-10-CM

## 2015-11-16 LAB — URINALYSIS W MICROSCOPIC + REFLEX CULTURE
BACTERIA UA: NONE SEEN [HPF]
BILIRUBIN URINE: NEGATIVE
CASTS: NONE SEEN [LPF]
CRYSTALS: NONE SEEN [HPF]
GLUCOSE, UA: NEGATIVE
KETONES UR: NEGATIVE
Leukocytes, UA: NEGATIVE
Nitrite: NEGATIVE
PH: 5 (ref 5.0–8.0)
Protein, ur: NEGATIVE
Specific Gravity, Urine: 1.015 (ref 1.001–1.035)
WBC, UA: NONE SEEN WBC/HPF (ref <=5)
YEAST: NONE SEEN [HPF]

## 2015-11-16 NOTE — Progress Notes (Signed)
   Patient is a 55 year old who presented to the office today for colposcopic evaluation as a result of her long-standing history of vulvar dysplasia. 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 Previously on tamoxifen status post completion of 5 years  History of VIN III June of 2011 invasive disease not seen. Wide local excision 04/22/2010 VIN-III also verrucous moderate squamous dysplasia right labia majora  Patient also said history of osteopenia and has history of depression for which she's been doing well Wellbutrin. Patient has a history in the past and 2006 for laparoscopic-assisted vaginal hysterectomy and bilateral salpingo-oophorectomy. The patient is currently on Evista 60 mg daily. She is taking her calcium and vitamin D twice a day .  On 07/04/2012 patient underwent a detail colposcopic evaluation the external genitalia after applying acetic acid demonstrated the small island like leukoplakic area. The area was cleansed with Betadine solution and a keypunch biopsy was obtained and submitted for histological evaluation. (inferior portion of the left labia majora) Pathology report demonstrated the following:  Diagnosis  Vulva, biopsy, right labia majora  - HIGH GRADE VULVAR INTRAEPITHELIAL NEOPLASIA, VIN-II / VIN-III.  On 07/18/2012 patient underwent wide local excision of the inferior portion of the left labia majora where the VIN 2/VI and 3 had previously been biopsied. Pathology report as follows:  Vulva, excision, left  - HIGH GRADE VULVAR INTRAEPITHELIAL NEOPLASM (VIN-II).  - THE SURGICAL RESECTION MARGINS ARE NEGATIVE FOR SQUAMOUS DYSPLASIA  Patient underwent a detail colposcopic evaluation of the external genitalia, perineum, and perirectal region. Acetic acid was applied along with blue filter no lesions were seen the required any biopsy at this time. The speculum was  introduced into the vagina. A systematic inspection of the vagina and vaginal cuff did not demonstrate any abnormalities.  Patient's recent Pap smear and mammogram were normal and her blood work was normal with the exception of her total cholesterol and LDL was slightly elevated.  Patient otherwise scheduled to return back next year for annual exam or when necessary. She did have a recent chest x-ray because of her long-standing history of smoking and was negative. She was to be started on the nicotine patch. She is due for her colonoscopy this year.

## 2015-11-18 LAB — URINE CULTURE: Colony Count: 15000

## 2015-11-22 ENCOUNTER — Telehealth: Payer: Self-pay | Admitting: *Deleted

## 2015-11-22 NOTE — Telephone Encounter (Signed)
Notes faxed to urology they will fax me back with time and date.  

## 2015-11-22 NOTE — Telephone Encounter (Signed)
-----   Message from Ramond Craver, [place] sent at 11/19/2015  4:13 PM EST ----- Regarding: referral to urologist Per Dr. Moshe Salisbury "Please inform patient I would like to refer her to urologist as a result of recurrent blood in her urine and no evidence of UTI. Since she is a chronic smoker this needs to be checked out to rule out bladder cancer. Make appointment at [facility] formher and send them a copy of her u/a's."  Patient was informed. She knows you will handle referral and she will hear about appt/date and time next week. Thanks!!!

## 2015-11-23 NOTE — Telephone Encounter (Signed)
Appointment on 01/05/16 @ 8:30am with Dr.Eskridge pt aware

## 2016-06-06 DIAGNOSIS — H5213 Myopia, bilateral: Secondary | ICD-10-CM | POA: Diagnosis not present

## 2016-10-13 ENCOUNTER — Encounter: Payer: 59 | Admitting: Gynecology

## 2016-10-18 ENCOUNTER — Other Ambulatory Visit: Payer: Self-pay | Admitting: Gynecology

## 2016-10-24 ENCOUNTER — Encounter: Payer: Self-pay | Admitting: Gynecology

## 2016-10-24 ENCOUNTER — Ambulatory Visit (INDEPENDENT_AMBULATORY_CARE_PROVIDER_SITE_OTHER): Payer: BLUE CROSS/BLUE SHIELD | Admitting: Gynecology

## 2016-10-24 VITALS — BP 128/80 | Ht 64.0 in | Wt 125.0 lb

## 2016-10-24 DIAGNOSIS — M8580 Other specified disorders of bone density and structure, unspecified site: Secondary | ICD-10-CM

## 2016-10-24 DIAGNOSIS — Z853 Personal history of malignant neoplasm of breast: Secondary | ICD-10-CM

## 2016-10-24 DIAGNOSIS — D071 Carcinoma in situ of vulva: Secondary | ICD-10-CM

## 2016-10-24 DIAGNOSIS — Z01419 Encounter for gynecological examination (general) (routine) without abnormal findings: Secondary | ICD-10-CM | POA: Diagnosis not present

## 2016-10-24 LAB — CBC WITH DIFFERENTIAL/PLATELET
BASOS ABS: 76 {cells}/uL (ref 0–200)
Basophils Relative: 1 %
EOS ABS: 152 {cells}/uL (ref 15–500)
EOS PCT: 2 %
HCT: 37.9 % (ref 35.0–45.0)
Hemoglobin: 12.5 g/dL (ref 11.7–15.5)
LYMPHS PCT: 33 %
Lymphs Abs: 2508 cells/uL (ref 850–3900)
MCH: 32.6 pg (ref 27.0–33.0)
MCHC: 33 g/dL (ref 32.0–36.0)
MCV: 98.7 fL (ref 80.0–100.0)
MONOS PCT: 8 %
MPV: 11.7 fL (ref 7.5–12.5)
Monocytes Absolute: 608 cells/uL (ref 200–950)
NEUTROS ABS: 4256 {cells}/uL (ref 1500–7800)
Neutrophils Relative %: 56 %
PLATELETS: 281 10*3/uL (ref 140–400)
RBC: 3.84 MIL/uL (ref 3.80–5.10)
RDW: 13.3 % (ref 11.0–15.0)
WBC: 7.6 10*3/uL (ref 3.8–10.8)

## 2016-10-24 LAB — COMPREHENSIVE METABOLIC PANEL
ALT: 9 U/L (ref 6–29)
AST: 15 U/L (ref 10–35)
Albumin: 3.8 g/dL (ref 3.6–5.1)
Alkaline Phosphatase: 55 U/L (ref 33–130)
BUN: 10 mg/dL (ref 7–25)
CHLORIDE: 106 mmol/L (ref 98–110)
CO2: 27 mmol/L (ref 20–31)
CREATININE: 0.97 mg/dL (ref 0.50–1.05)
Calcium: 9.3 mg/dL (ref 8.6–10.4)
GLUCOSE: 70 mg/dL (ref 65–99)
POTASSIUM: 3.9 mmol/L (ref 3.5–5.3)
SODIUM: 140 mmol/L (ref 135–146)
TOTAL PROTEIN: 6.4 g/dL (ref 6.1–8.1)
Total Bilirubin: 0.4 mg/dL (ref 0.2–1.2)

## 2016-10-24 LAB — LIPID PANEL
Cholesterol: 187 mg/dL (ref <200)
HDL: 43 mg/dL — ABNORMAL LOW (ref >50)
LDL CALC: 123 mg/dL — AB (ref <100)
Total CHOL/HDL Ratio: 4.3 Ratio (ref <5.0)
Triglycerides: 104 mg/dL (ref <150)
VLDL: 21 mg/dL (ref <30)

## 2016-10-24 LAB — TSH: TSH: 1.66 m[IU]/L

## 2016-10-24 MED ORDER — BUPROPION HCL ER (XL) 150 MG PO TB24: 150.0000 mg | ORAL_TABLET | Freq: Every day | ORAL | 11 refills | Status: DC

## 2016-10-24 MED ORDER — RALOXIFENE HCL 60 MG PO TABS: 60.0000 mg | ORAL_TABLET | Freq: Every day | ORAL | 11 refills | Status: DC

## 2016-10-24 NOTE — Patient Instructions (Addendum)
Colonoscopy, Adult A colonoscopy is an exam to look at the entire large intestine. During the exam, a lubricated, bendable tube is inserted into the anus and then passed into the rectum, colon, and other parts of the large intestine. A colonoscopy is often done as a part of normal colorectal screening or in response to certain symptoms, such as anemia, persistent diarrhea, abdominal pain, and blood in the stool. The exam can help screen for and diagnose medical problems, including:  Tumors.  Polyps.  Inflammation.  Areas of bleeding. Tell a health care provider about:  Any allergies you have.  All medicines you are taking, including vitamins, herbs, eye drops, creams, and over-the-counter medicines.  Any problems you or family members have had with anesthetic medicines.  Any blood disorders you have.  Any surgeries you have had.  Any medical conditions you have.  Any problems you have had passing stool. What are the risks? Generally, this is a safe procedure. However, problems may occur, including:  Bleeding.  A tear in the intestine.  A reaction to medicines given during the exam.  Infection (rare). What happens before the procedure? Eating and drinking restrictions  Follow instructions from your health care provider about eating and drinking, which may include:  A few days before the procedure - follow a low-fiber diet. Avoid nuts, seeds, dried fruit, raw fruits, and vegetables.  1-3 days before the procedure - follow a clear liquid diet. Drink only clear liquids, such as clear broth or bouillon, black coffee or tea, clear juice, clear soft drinks or sports drinks, gelatin desert, and popsicles. Avoid any liquids that contain red or purple dye.  On the day of the procedure - do not eat or drink anything during the 2 hours before the procedure, or within the time period that your health care provider recommends. Bowel prep  If you were prescribed an oral bowel prep to  clean out your colon:  Take it as told by your health care provider. Starting the day before your procedure, you will need to drink a large amount of medicated liquid. The liquid will cause you to have multiple loose stools until your stool is almost clear or light green.  If your skin or anus gets irritated from diarrhea, you may use these to relieve the irritation:  Medicated wipes, such as adult wet wipes with aloe and vitamin E.  A skin soothing-product like petroleum jelly.  If you vomit while drinking the bowel prep, take a break for up to 60 minutes and then begin the bowel prep again. If vomiting continues and you cannot take the bowel prep without vomiting, call your health care provider. General instructions  Ask your health care provider about changing or stopping your regular medicines. This is especially important if you are taking diabetes medicines or blood thinners.  Plan to have someone take you home from the hospital or clinic. What happens during the procedure?  An IV tube may be inserted into one of your veins.  You will be given medicine to help you relax (sedative).  To reduce your risk of infection:  Your health care team will wash or sanitize their hands.  Your anal area will be washed with soap.  You will be asked to lie on your side with your knees bent.  Your health care provider will lubricate a long, thin, flexible tube. The tube will have a camera and a light on the end.  The tube will be inserted into your anus.  The tube will be gently eased through your rectum and colon.  Air will be delivered into your colon to keep it open. You may feel some pressure or cramping.  The camera will be used to take images during the procedure.  A small tissue sample may be removed from your body to be examined under a microscope (biopsy). If any potential problems are found, the tissue will be sent to a lab for testing.  If small polyps are found, your health  care provider may remove them and have them checked for cancer cells.  The tube that was inserted into your anus will be slowly removed. The procedure may vary among health care providers and hospitals. What happens after the procedure?  Your blood pressure, heart rate, breathing rate, and blood oxygen level will be monitored until the medicines you were given have worn off.  Do not drive for 24 hours after the exam.  You may have a small amount of blood in your stool.  You may pass gas and have mild abdominal cramping or bloating due to the air that was used to inflate your colon during the exam.  It is up to you to get the results of your procedure. Ask your health care provider, or the department performing the procedure, when your results will be ready. This information is not intended to replace advice given to you by your health care provider. Make sure you discuss any questions you have with your health care provider. Document Released: 09/15/2000 Document Revised: 04/07/2016 Document Reviewed: 11/30/2015 Elsevier Interactive Patient Education  2017 [employer] Influenza Virus Vaccine (Flucelvax) What is this medicine? INFLUENZA VIRUS VACCINE (in floo EN zuh VAHY ruhs vak SEEN) helps to reduce the risk of getting influenza also known as the flu. The vaccine only helps protect you against some strains of the flu. COMMON BRAND NAME(S): FLUCELVAX What should I tell my health care provider before I take this medicine? They need to know if you have any of these conditions: -bleeding disorder like hemophilia -fever or infection -Guillain-Barre syndrome or other neurological problems -immune system problems -infection with the human immunodeficiency virus (HIV) or AIDS -low blood platelet counts -multiple sclerosis -an unusual or allergic reaction to influenza virus vaccine, other medicines, foods, dyes or preservatives -pregnant or trying to get pregnant -breast-feeding How  should I use this medicine? This vaccine is for injection into a muscle. It is given by a health care professional. A copy of Vaccine Information Statements will be given before each vaccination. Read this sheet carefully each time. The sheet may change frequently. Talk to your pediatrician regarding the use of this medicine in children. Special care may be needed. Overdosage: If you think you've taken too much of this medicine contact a poison control center or emergency room at once. What if I miss a dose? This does not apply. What may interact with this medicine? -chemotherapy or radiation therapy -medicines that lower your immune system like etanercept, anakinra, infliximab, and adalimumab -medicines that treat or prevent blood clots like warfarin -phenytoin -steroid medicines like prednisone or cortisone -theophylline -vaccines What should I watch for while using this medicine? Report any side effects that do not go away within 3 days to your doctor or health care professional. Call your health care provider if any unusual symptoms occur within 6 weeks of receiving this vaccine. You may still catch the flu, but the illness is not usually as bad. You cannot get the flu from the vaccine. The  vaccine will not protect against colds or other illnesses that may cause fever. The vaccine is needed every year. What side effects may I notice from receiving this medicine? Side effects that you should report to your doctor or health care professional as soon as possible: -allergic reactions like skin rash, itching or hives, swelling of the face, lips, or tongue Side effects that usually do not require medical attention (Report these to your doctor or health care professional if they continue or are bothersome.): -fever -headache -muscle aches and pains -pain, tenderness, redness, or swelling at the injection site -tiredness Where should I keep my medicine? The vaccine will be given by a health  care professional in a clinic, pharmacy, doctor's office, or other health care setting. You will not be given vaccine doses to store at home.  2017 Elsevier/Gold Standard (2011-08-30 14:06:47)

## 2016-10-24 NOTE — Progress Notes (Signed)
[name] 1961-06-19 [id number]   History:    56 y.o.  for annual gyn exam with no complaints today. She is somewhat saddened due to the fact that her father just passed away about a week ago and her daughter has been back to rehabilitation as a result of drug addiction. She is currently on Wellbutrin. She is coping well. Patient continues to smoke half a pack cigarette per day. Last year she had a normal chest x-ray. She's been a smoker over 20 years. She is trying for the Chantix as well as a nicotine patch.  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 Previously on tamoxifen status post completion of 5 years  History of VIN III June of 2011 invasive disease not seen. Wide local excision 04/22/2010 VIN-III also verrucous moderate squamous dysplasia right labia majora  Patient also said history of osteopenia and has history of depression for which she's been doing well Wellbutrin. Patient has a history in the past and 2006 for laparoscopic-assisted vaginal hysterectomy and bilateral salpingo-oophorectomy. The patient is currently on Evista 60 mg daily. She is taking her calcium and vitamin D twice a day .  On 07/04/2012 patient underwent a detail colposcopic evaluation the external genitalia after applying acetic acid demonstrated the small island like leukoplakic area. The area was cleansed with Betadine solution and a keypunch biopsy was obtained and submitted for histological evaluation. (inferior portion of the left labia majora) Pathology report demonstrated the following:  Diagnosis  Vulva, biopsy, right labia majora  - HIGH GRADE VULVAR INTRAEPITHELIAL NEOPLASIA, VIN-II / VIN-III.  On 07/18/2012 patient underwent wide local excision of the inferior portion of the left labia majora where the VIN 2/VI and 3 had previously been biopsied. Pathology report as  follows:  Vulva, excision, left  - HIGH GRADE VULVAR INTRAEPITHELIAL NEOPLASM (VIN-II).  - THE SURGICAL RESECTION MARGINS ARE NEGATIVE FOR SQUAMOUS DYSPLASIA.   Patient 2012 had a colonoscopy benign polyps removed she's on a 5 year recall. Patient with no past history of any abnormal Pap smear. Her last bone density study in 2016 demonstrated bilateral femoral neck T score -1.2 and there was a statistically significant decrease in bone mineralization of the AP spine of a -7.9% when compared with 2013.  Patient Skene yearly colposcopic evaluation as of the external genitalia last was normal in July 2017. Patient requesting flu vaccine today.  Past medical history,surgical history, family history and social history were all reviewed and documented in the EPIC chart.  Gynecologic History No LMP recorded. Patient has had a hysterectomy. Contraception: status post hysterectomy Last Pap: 2017. Results were: normal Last mammogram: 2017. Results were: normal  Obstetric History OB History  Gravida Para Term Preterm AB Living  1 1 1     1   SAB TAB Ectopic Multiple Live Births          1    # Outcome Date GA Lbr Len/2nd Weight Sex Delivery Anes PTL Lv  1 Term     F Vag-Spont  N LIV       ROS: A ROS was performed and pertinent positives and negatives are included in the history.  GENERAL: No fevers or chills. HEENT: No change in vision, no earache, sore throat or sinus congestion. NECK: No pain or stiffness. CARDIOVASCULAR: No chest pain or pressure. No palpitations. PULMONARY: No shortness of breath, cough or wheeze. GASTROINTESTINAL: No abdominal pain, nausea,  vomiting or diarrhea, melena or bright red blood per rectum. GENITOURINARY: No urinary frequency, urgency, hesitancy or dysuria. MUSCULOSKELETAL: No joint or muscle pain, no back pain, no recent trauma. DERMATOLOGIC: No rash, no itching, no lesions. ENDOCRINE: No polyuria, polydipsia, no heat or cold intolerance. No recent change in  weight. HEMATOLOGICAL: No anemia or easy bruising or bleeding. NEUROLOGIC: No headache, seizures, numbness, tingling or weakness. PSYCHIATRIC: No depression, no loss of interest in normal activity or change in sleep pattern.     Exam: chaperone present  BP 128/80   Ht 5\' 4"  (1.626 m)   Wt 125 lb (56.7 kg)   BMI 21.46 kg/m   Body mass index is 21.46 kg/m.  General appearance : Well developed well nourished female. No acute distress HEENT: Eyes: no retinal hemorrhage or exudates,  Neck supple, trachea midline, no carotid bruits, no thyroidmegaly Lungs: Clear to auscultation, no rhonchi or wheezes, or rib retractions  Heart: Regular rate and rhythm, no murmurs or gallops Breast:Examined in sitting and supine position were symmetrical in appearance, no palpable masses or tenderness,  no skin retraction, no nipple inversion, no nipple discharge, no skin discoloration, no axillary or supraclavicular lymphadenopathy Abdomen: no palpable masses or tenderness, no rebound or guarding Extremities: no edema or skin discoloration or tenderness  Pelvic:  Bartholin, Urethra, Skene Glands: Within normal limits             Vagina: No gross lesions or discharge  Cervix: Absent  Uterus  absent  Adnexa  Without masses or tenderness  Anus and perineum  normal   Rectovaginal  normal sphincter tone without palpated masses or tenderness             Hemoccult colonoscopy this year     Assessment/Plan:  56 y.o. female for annual exam with history of osteopenia and past history of breast cancer chronic smoker currently on Evista 60 mg daily prescription refill. Patient to schedule mammogram and bone density study for next month. She is also overdue for her colonoscopy. Patient received the flu vaccine today per her request. Patient was counseled prior to vaccination. Patient was reminded on the importance of calcium vitamin D and weightbearing exercise for osteoporosis prevention. She was also counseled once  again the detrimental effects of smoking. Pap smear not done today. Patient to return back in July for detail colposcopic evaluation of the external genitalia as a yearly follow-up as a result of her past history of vulvar dysplasia. The following fasting blood work was ordered today: Fasting lipid profile, comprehensive metabolic panel, TSH, CBC, and urinalysis.   Terrance Mass MD, 2:29 PM 10/24/2016

## 2016-10-25 LAB — URINALYSIS W MICROSCOPIC + REFLEX CULTURE
Bacteria, UA: NONE SEEN [HPF]
Bilirubin Urine: NEGATIVE
CASTS: NONE SEEN [LPF]
CRYSTALS: NONE SEEN [HPF]
Glucose, UA: NEGATIVE
KETONES UR: NEGATIVE
Leukocytes, UA: NEGATIVE
NITRITE: NEGATIVE
PH: 6 (ref 5.0–8.0)
Protein, ur: NEGATIVE
SPECIFIC GRAVITY, URINE: 1.015 (ref 1.001–1.035)
Squamous Epithelial / LPF: NONE SEEN [HPF] (ref <=5)
WBC, UA: NONE SEEN WBC/HPF (ref <=5)
Yeast: NONE SEEN [HPF]

## 2016-10-26 LAB — URINE CULTURE: ORGANISM ID, BACTERIA: NO GROWTH

## 2016-11-02 ENCOUNTER — Encounter: Payer: Self-pay | Admitting: Gynecology

## 2016-11-02 DIAGNOSIS — Z780 Asymptomatic menopausal state: Secondary | ICD-10-CM | POA: Diagnosis not present

## 2016-11-02 DIAGNOSIS — Z1231 Encounter for screening mammogram for malignant neoplasm of breast: Secondary | ICD-10-CM | POA: Diagnosis not present

## 2016-11-02 DIAGNOSIS — M8589 Other specified disorders of bone density and structure, multiple sites: Secondary | ICD-10-CM | POA: Diagnosis not present

## 2016-11-02 DIAGNOSIS — Z853 Personal history of malignant neoplasm of breast: Secondary | ICD-10-CM | POA: Diagnosis not present

## 2016-11-04 ENCOUNTER — Other Ambulatory Visit: Payer: Self-pay | Admitting: Gynecology

## 2016-11-10 ENCOUNTER — Encounter: Payer: Self-pay | Admitting: Anesthesiology

## 2016-11-20 DIAGNOSIS — B370 Candidal stomatitis: Secondary | ICD-10-CM | POA: Diagnosis not present

## 2016-12-12 ENCOUNTER — Other Ambulatory Visit: Payer: Self-pay

## 2016-12-12 MED ORDER — BUPROPION HCL ER (XL) 150 MG PO TB24: 150.0000 mg | ORAL_TABLET | Freq: Every day | ORAL | 3 refills | Status: DC

## 2016-12-12 MED ORDER — RALOXIFENE HCL 60 MG PO TABS: 60.0000 mg | ORAL_TABLET | Freq: Every day | ORAL | 3 refills | Status: DC

## 2016-12-12 NOTE — Telephone Encounter (Signed)
Phamacy sent fax request for 90 days supply.

## 2017-02-14 ENCOUNTER — Encounter: Payer: Self-pay | Admitting: Gynecology

## 2017-03-21 ENCOUNTER — Ambulatory Visit (INDEPENDENT_AMBULATORY_CARE_PROVIDER_SITE_OTHER): Payer: BLUE CROSS/BLUE SHIELD | Admitting: Gynecology

## 2017-03-21 ENCOUNTER — Encounter: Payer: Self-pay | Admitting: Gynecology

## 2017-03-21 VITALS — BP 110/80

## 2017-03-21 DIAGNOSIS — Z87412 Personal history of vulvar dysplasia: Secondary | ICD-10-CM

## 2017-03-21 NOTE — Progress Notes (Signed)
   Patient is a 56 year old who presented to the office for her annual external genital colposcopic evaluation because of her past history of vulvar dysplasia Patient with past history of LAVH BSO. Her vulvar dysplasia history as follows:  03/31/2010 biopsy of fourchette VIN III 04/22/2010 wide  local excision of fourchette VIN III and right labia majora excision of verruca with moderate squamous dysplasia 07/04/2012 wide local excision of inferior portion of the left labia majora VIN III with negative margins (pathologist had documented biopsy as being right labia majora and had a addendum with correction stating that it was the left labia majora patient informed)  Patient underwent a detail colposcopic evaluation today of the external genitalia, perineum and perirectal region and after application of acetic acid no abnormality was noted. The speculum was introduced into the vagina. The vagina and vaginal cuff were inspected and after applying acetic acid no abnormality was noted.  Assessment/plan: 56 year old patient with past history of severe vulvar dysplasia with negative colposcopy. Patient due for annual exam next year would recommend yearly colposcopic evaluation of external genitalia.

## 2017-07-24 DIAGNOSIS — R3121 Asymptomatic microscopic hematuria: Secondary | ICD-10-CM | POA: Diagnosis not present

## 2017-11-20 DIAGNOSIS — Z853 Personal history of malignant neoplasm of breast: Secondary | ICD-10-CM | POA: Diagnosis not present

## 2017-11-20 DIAGNOSIS — Z1231 Encounter for screening mammogram for malignant neoplasm of breast: Secondary | ICD-10-CM | POA: Diagnosis not present

## 2017-12-13 ENCOUNTER — Encounter: Payer: Self-pay | Admitting: Obstetrics & Gynecology

## 2017-12-13 ENCOUNTER — Ambulatory Visit (INDEPENDENT_AMBULATORY_CARE_PROVIDER_SITE_OTHER): Payer: BLUE CROSS/BLUE SHIELD | Admitting: Obstetrics & Gynecology

## 2017-12-13 VITALS — BP 110/70 | Ht 64.0 in | Wt 120.0 lb

## 2017-12-13 DIAGNOSIS — Z90710 Acquired absence of both cervix and uterus: Secondary | ICD-10-CM | POA: Diagnosis not present

## 2017-12-13 DIAGNOSIS — M8589 Other specified disorders of bone density and structure, multiple sites: Secondary | ICD-10-CM

## 2017-12-13 DIAGNOSIS — C50912 Malignant neoplasm of unspecified site of left female breast: Secondary | ICD-10-CM

## 2017-12-13 DIAGNOSIS — D071 Carcinoma in situ of vulva: Secondary | ICD-10-CM | POA: Diagnosis not present

## 2017-12-13 DIAGNOSIS — Z1151 Encounter for screening for human papillomavirus (HPV): Secondary | ICD-10-CM

## 2017-12-13 DIAGNOSIS — Z01411 Encounter for gynecological examination (general) (routine) with abnormal findings: Secondary | ICD-10-CM | POA: Diagnosis not present

## 2017-12-13 DIAGNOSIS — Z780 Asymptomatic menopausal state: Secondary | ICD-10-CM | POA: Diagnosis not present

## 2017-12-13 DIAGNOSIS — Z170 Estrogen receptor positive status [ER+]: Secondary | ICD-10-CM

## 2017-12-13 MED ORDER — BUPROPION HCL ER (XL) 150 MG PO TB24: 150.0000 mg | ORAL_TABLET | Freq: Every day | ORAL | 4 refills | Status: DC

## 2017-12-13 MED ORDER — RALOXIFENE HCL 60 MG PO TABS: 60.0000 mg | ORAL_TABLET | Freq: Every day | ORAL | 4 refills | Status: DC

## 2017-12-13 NOTE — Progress Notes (Signed)
[name] July 09, 1961 [id number]   History:    57 y.o. 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  Daughter is 11 yo on Methadone  RP:  Established patient presenting for annual gyn exam   HPI: Status post LAVH/BSO in 2006.  Well on no hormone replacement therapy.  History of left breast cancer in 2004.  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  She had lumpectomy followed by radiation and then tamoxifen for 5 years.  Breasts normal now.  Recent screening mammogram February 2019 was negative.  History of VIN 3 post wide excision in 2011.  She had a recurrence of the VIN 3 in 2013 for which an excision was done showing negative margins.  Negative vulvar colposcopy in June 2018.  Patient is a smoker.  Trying to quit on nicotine patch.  Patient is having no pelvic pain.  She is sexually active with her husband without difficulty or pain.  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  Past medical history,surgical history, family history and social history were all reviewed and documented in the EPIC chart.  Gynecologic History No LMP recorded. Patient has had a hysterectomy. Contraception: status post hysterectomy Last Pap: 2017. Results were: Negative Last mammogram: 11/2017. Results were: Negative Bone Density: 11/2016 Osteopenia Colonoscopy: 2012  Obstetric History OB History  Gravida Para Term Preterm AB Living  '1 1 1     1  ' SAB TAB Ectopic Multiple Live Births          1    # Outcome Date GA Lbr Len/2nd Weight Sex Delivery Anes PTL Lv  1 Term     F Vag-Spont  N LIV       ROS: A ROS was performed and pertinent positives and negatives are included in the history.  GENERAL: No fevers or chills. HEENT: No change in vision, no earache, sore throat or sinus congestion. NECK: No pain or stiffness. CARDIOVASCULAR: No chest pain or pressure. No palpitations. PULMONARY: No shortness of breath, cough or wheeze. GASTROINTESTINAL: No abdominal pain, nausea, vomiting or diarrhea,  melena or bright red blood per rectum. GENITOURINARY: No urinary frequency, urgency, hesitancy or dysuria. MUSCULOSKELETAL: No joint or muscle pain, no back pain, no recent trauma. DERMATOLOGIC: No rash, no itching, no lesions. ENDOCRINE: No polyuria, polydipsia, no heat or cold intolerance. No recent change in weight. HEMATOLOGICAL: No anemia or easy bruising or bleeding. NEUROLOGIC: No headache, seizures, numbness, tingling or weakness. PSYCHIATRIC: No depression, no loss of interest in normal activity or change in sleep pattern.     Exam:   BP 110/70   Ht '5\' 4"'  (1.626 m)   Wt 120 lb (54.4 kg)   BMI 20.60 kg/m   Body mass index is 20.6 kg/m.  General appearance : Well developed well nourished female. No acute distress HEENT: Eyes: no retinal hemorrhage or exudates,  Neck supple, trachea midline, no carotid bruits, no thyroidmegaly Lungs: Clear to auscultation, no rhonchi or wheezes, or rib retractions  Heart: Regular rate and rhythm, no murmurs or gallops Breast:Examined in sitting and supine position were symmetrical in appearance, no palpable masses or tenderness,  no skin retraction, no nipple inversion, no nipple discharge, no skin discoloration, no axillary or supraclavicular lymphadenopathy Abdomen: no palpable masses or tenderness, no rebound or guarding Extremities: no edema or skin discoloration or tenderness  Pelvic: Vulva: Normal             Vagina: No gross lesions or discharge.  Pap/HR  HPV done  Cervix/Uterus absent  Adnexa  Without masses or tenderness  Anus: Normal   Assessment/Plan:  57 y.o. female for annual exam   1. Encounter for gynecological examination with abnormal finding Gynecologic exam status post LAVH/BSO.  Pap with high-risk HPV done on the vaginal vault.  Breast exam normal status post left lumpectomy.  Screening mammogram negative in February 2019.  Colonoscopy in 2012.  Health labs here today.  Stable on Wellbutrin, represcribed. - CBC - Comp Met  (CMET) - Lipid panel - TSH - VITAMIN D 25 Hydroxy (Vit-D Deficiency, Fractures)  2. History of total hysterectomy  3. Menopause present Well on no hormone replacement therapy.  4. Vulvar intraepithelial neoplasia (VIN) grade 3 Vulva grossly normal today.  Will follow up in 3 months for a vulvar colposcopy.  5. Malignant neoplasm of left breast, stage 1, estrogen receptor positive ([place]) Recent screening mammogram negative.  Will continue to do screening mammogram annually.  6. Osteopenia of multiple sites Osteopenia on Evista.  Vitamin D supplements, calcium rich nutrition and regular weightbearing physical activity to continue.  Will schedule repeat bone density in February 2020.   [name], 3:19 PM 12/13/2017

## 2017-12-13 NOTE — Addendum Note (Signed)
Addended by: Thurnell Garbe A on: 12/13/2017 04:41 PM   Modules accepted: Orders

## 2017-12-13 NOTE — Patient Instructions (Signed)
1. Encounter for gynecological examination with abnormal finding Gynecologic exam status post LAVH/BSO.  Pap with high-risk HPV done on the vaginal vault.  Breast exam normal status post left lumpectomy.  Screening mammogram negative in February 2019.  Colonoscopy in 2012.  Health labs here today.  Stable on Wellbutrin, represcribed. - CBC - Comp Met (CMET) - Lipid panel - TSH - VITAMIN D 25 Hydroxy (Vit-D Deficiency, Fractures)  2. History of total hysterectomy  3. Menopause present Well on no hormone replacement therapy.  4. Vulvar intraepithelial neoplasia (VIN) grade 3 Vulva grossly normal today.  Will follow up in 3 months for a vulvar colposcopy.  5. Malignant neoplasm of left breast, stage 1, estrogen receptor positive ([place]) Recent screening mammogram negative.  Will continue to do screening mammogram annually.  6. Osteopenia of multiple sites Osteopenia on Evista.  Vitamin D supplements, calcium rich nutrition and regular weightbearing physical activity to continue.  Will schedule repeat bone density in February 2020.  Geryl, it was a pleasure meeting you today!  I will inform you of your results as soon as they are available.   Health Maintenance for Postmenopausal Women Menopause is a normal process in which your reproductive ability comes to an end. This process happens gradually over a span of months to years, usually between the ages of 42 and 25. Menopause is complete when you have missed 12 consecutive menstrual periods. It is important to talk with your health care provider about some of the most common conditions that affect postmenopausal women, such as heart disease, cancer, and bone loss (osteoporosis). Adopting a healthy lifestyle and getting preventive care can help to promote your health and wellness. Those actions can also lower your chances of developing some of these common conditions. What should I know about menopause? During menopause, you may experience a  number of symptoms, such as:  Moderate-to-severe hot flashes.  Night sweats.  Decrease in sex drive.  Mood swings.  Headaches.  Tiredness.  Irritability.  Memory problems.  Insomnia.  Choosing to treat or not to treat menopausal changes is an individual decision that you make with your health care provider. What should I know about hormone replacement therapy and supplements? Hormone therapy products are effective for treating symptoms that are associated with menopause, such as hot flashes and night sweats. Hormone replacement carries certain risks, especially as you become older. If you are thinking about using estrogen or estrogen with progestin treatments, discuss the benefits and risks with your health care provider. What should I know about heart disease and stroke? Heart disease, heart attack, and stroke become more likely as you age. This may be due, in part, to the hormonal changes that your body experiences during menopause. These can affect how your body processes dietary fats, triglycerides, and cholesterol. Heart attack and stroke are both medical emergencies. There are many things that you can do to help prevent heart disease and stroke:  Have your blood pressure checked at least every 1-2 years. High blood pressure causes heart disease and increases the risk of stroke.  If you are 22-33 years old, ask your health care provider if you should take aspirin to prevent a heart attack or a stroke.  Do not use any tobacco products, including cigarettes, chewing tobacco, or electronic cigarettes. If you need help quitting, ask your health care provider.  It is important to eat a healthy diet and maintain a healthy weight. ? Be sure to include plenty of vegetables, fruits, low-fat dairy products, and  lean protein. ? Avoid eating foods that are high in solid fats, added sugars, or salt (sodium).  Get regular exercise. This is one of the most important things that you can do  for your health. ? Try to exercise for at least 150 minutes each week. The type of exercise that you do should increase your heart rate and make you sweat. This is known as moderate-intensity exercise. ? Try to do strengthening exercises at least twice each week. Do these in addition to the moderate-intensity exercise.  Know your numbers.Ask your health care provider to check your cholesterol and your blood glucose. Continue to have your blood tested as directed by your health care provider.  What should I know about cancer screening? There are several types of cancer. Take the following steps to reduce your risk and to catch any cancer development as early as possible. Breast Cancer  Practice breast self-awareness. ? This means understanding how your breasts normally appear and feel. ? It also means doing regular breast self-exams. Let your health care provider know about any changes, no matter how small.  If you are 42 or older, have a clinician do a breast exam (clinical breast exam or CBE) every year. Depending on your age, family history, and medical history, it may be recommended that you also have a yearly breast X-ray (mammogram).  If you have a family history of breast cancer, talk with your health care provider about genetic screening.  If you are at high risk for breast cancer, talk with your health care provider about having an MRI and a mammogram every year.  Breast cancer (BRCA) gene test is recommended for women who have family members with BRCA-related cancers. Results of the assessment will determine the need for genetic counseling and BRCA1 and for BRCA2 testing. BRCA-related cancers include these types: ? Breast. This occurs in males or females. ? Ovarian. ? Tubal. This may also be called fallopian tube cancer. ? Cancer of the abdominal or pelvic lining (peritoneal cancer). ? Prostate. ? Pancreatic.  Cervical, Uterine, and Ovarian Cancer Your health care provider may  recommend that you be screened regularly for cancer of the pelvic organs. These include your ovaries, uterus, and vagina. This screening involves a pelvic exam, which includes checking for microscopic changes to the surface of your cervix (Pap test).  For women ages 21-65, health care providers may recommend a pelvic exam and a Pap test every three years. For women ages 60-65, they may recommend the Pap test and pelvic exam, combined with testing for human papilloma virus (HPV), every five years. Some types of HPV increase your risk of cervical cancer. Testing for HPV may also be done on women of any age who have unclear Pap test results.  Other health care providers may not recommend any screening for nonpregnant women who are considered low risk for pelvic cancer and have no symptoms. Ask your health care provider if a screening pelvic exam is right for you.  If you have had past treatment for cervical cancer or a condition that could lead to cancer, you need Pap tests and screening for cancer for at least 20 years after your treatment. If Pap tests have been discontinued for you, your risk factors (such as having a new sexual partner) need to be reassessed to determine if you should start having screenings again. Some women have medical problems that increase the chance of getting cervical cancer. In these cases, your health care provider may recommend that you have  screening and Pap tests more often.  If you have a family history of uterine cancer or ovarian cancer, talk with your health care provider about genetic screening.  If you have vaginal bleeding after reaching menopause, tell your health care provider.  There are currently no reliable tests available to screen for ovarian cancer.  Lung Cancer Lung cancer screening is recommended for adults 14-77 years old who are at high risk for lung cancer because of a history of smoking. A yearly low-dose CT scan of the lungs is recommended if  you:  Currently smoke.  Have a history of at least 30 pack-years of smoking and you currently smoke or have quit within the past 15 years. A pack-year is smoking an average of one pack of cigarettes per day for one year.  Yearly screening should:  Continue until it has been 15 years since you quit.  Stop if you develop a health problem that would prevent you from having lung cancer treatment.  Colorectal Cancer  This type of cancer can be detected and can often be prevented.  Routine colorectal cancer screening usually begins at age 2 and continues through age 1.  If you have risk factors for colon cancer, your health care provider may recommend that you be screened at an earlier age.  If you have a family history of colorectal cancer, talk with your health care provider about genetic screening.  Your health care provider may also recommend using home test kits to check for hidden blood in your stool.  A small camera at the end of a tube can be used to examine your colon directly (sigmoidoscopy or colonoscopy). This is done to check for the earliest forms of colorectal cancer.  Direct examination of the colon should be repeated every 5-10 years until age 43. However, if early forms of precancerous polyps or small growths are found or if you have a family history or genetic risk for colorectal cancer, you may need to be screened more often.  Skin Cancer  Check your skin from head to toe regularly.  Monitor any moles. Be sure to tell your health care provider: ? About any new moles or changes in moles, especially if there is a change in a mole's shape or color. ? If you have a mole that is larger than the size of a pencil eraser.  If any of your family members has a history of skin cancer, especially at a young age, talk with your health care provider about genetic screening.  Always use sunscreen. Apply sunscreen liberally and repeatedly throughout the day.  Whenever you are  outside, protect yourself by wearing long sleeves, pants, a wide-brimmed hat, and sunglasses.  What should I know about osteoporosis? Osteoporosis is a condition in which bone destruction happens more quickly than new bone creation. After menopause, you may be at an increased risk for osteoporosis. To help prevent osteoporosis or the bone fractures that can happen because of osteoporosis, the following is recommended:  If you are 4-70 years old, get at least 1,000 mg of calcium and at least 600 mg of vitamin D per day.  If you are older than age 86 but younger than age 1, get at least 1,200 mg of calcium and at least 600 mg of vitamin D per day.  If you are older than age 12, get at least 1,200 mg of calcium and at least 800 mg of vitamin D per day.  Smoking and excessive alcohol intake increase the risk  of osteoporosis. Eat foods that are rich in calcium and vitamin D, and do weight-bearing exercises several times each week as directed by your health care provider. What should I know about how menopause affects my mental health? Depression may occur at any age, but it is more common as you become older. Common symptoms of depression include:  Low or sad mood.  Changes in sleep patterns.  Changes in appetite or eating patterns.  Feeling an overall lack of motivation or enjoyment of activities that you previously enjoyed.  Frequent crying spells.  Talk with your health care provider if you think that you are experiencing depression. What should I know about immunizations? It is important that you get and maintain your immunizations. These include:  Tetanus, diphtheria, and pertussis (Tdap) booster vaccine.  Influenza every year before the flu season begins.  Pneumonia vaccine.  Shingles vaccine.  Your health care provider may also recommend other immunizations. This information is not intended to replace advice given to you by your health care provider. Make sure you discuss  any questions you have with your health care provider. Document Released: 11/10/2005 Document Revised: 04/07/2016 Document Reviewed: 06/22/2015 Elsevier Interactive Patient Education  2018 [employer].

## 2017-12-14 LAB — LIPID PANEL
Cholesterol: 205 mg/dL — ABNORMAL HIGH (ref <200)
HDL: 38 mg/dL — ABNORMAL LOW (ref >50)
LDL Cholesterol (Calc): 131 mg/dL (calc) — ABNORMAL HIGH
NON-HDL CHOLESTEROL (CALC): 167 mg/dL — AB (ref <130)
TRIGLYCERIDES: 219 mg/dL — AB (ref <150)
Total CHOL/HDL Ratio: 5.4 (calc) — ABNORMAL HIGH (ref <5.0)

## 2017-12-14 LAB — COMPREHENSIVE METABOLIC PANEL
AG RATIO: 2 (calc) (ref 1.0–2.5)
ALT: 10 U/L (ref 6–29)
AST: 16 U/L (ref 10–35)
Albumin: 4.1 g/dL (ref 3.6–5.1)
Alkaline phosphatase (APISO): 66 U/L (ref 33–130)
BILIRUBIN TOTAL: 0.4 mg/dL (ref 0.2–1.2)
BUN: 9 mg/dL (ref 7–25)
CALCIUM: 9.5 mg/dL (ref 8.6–10.4)
CHLORIDE: 105 mmol/L (ref 98–110)
CO2: 30 mmol/L (ref 20–32)
Creat: 0.96 mg/dL (ref 0.50–1.05)
GLOBULIN: 2.1 g/dL (ref 1.9–3.7)
Glucose, Bld: 62 mg/dL — ABNORMAL LOW (ref 65–99)
Potassium: 4 mmol/L (ref 3.5–5.3)
SODIUM: 141 mmol/L (ref 135–146)
TOTAL PROTEIN: 6.2 g/dL (ref 6.1–8.1)

## 2017-12-14 LAB — CBC
HEMATOCRIT: 36.8 % (ref 35.0–45.0)
HEMOGLOBIN: 13 g/dL (ref 11.7–15.5)
MCH: 33.7 pg — AB (ref 27.0–33.0)
MCHC: 35.3 g/dL (ref 32.0–36.0)
MCV: 95.3 fL (ref 80.0–100.0)
MPV: 13.1 fL — ABNORMAL HIGH (ref 7.5–12.5)
Platelets: 212 10*3/uL (ref 140–400)
RBC: 3.86 10*6/uL (ref 3.80–5.10)
RDW: 11.7 % (ref 11.0–15.0)
WBC: 8.2 10*3/uL (ref 3.8–10.8)

## 2017-12-14 LAB — VITAMIN D 25 HYDROXY (VIT D DEFICIENCY, FRACTURES): VIT D 25 HYDROXY: 40 ng/mL (ref 30–100)

## 2017-12-14 LAB — TSH: TSH: 1.5 m[IU]/L (ref 0.40–4.50)

## 2017-12-19 LAB — PAP, TP IMAGING W/ HPV RNA, RFLX HPV TYPE 16,18/45: HPV DNA HIGH RISK: NOT DETECTED

## 2017-12-24 DIAGNOSIS — F329 Major depressive disorder, single episode, unspecified: Secondary | ICD-10-CM | POA: Diagnosis not present

## 2017-12-24 DIAGNOSIS — E785 Hyperlipidemia, unspecified: Secondary | ICD-10-CM | POA: Diagnosis not present

## 2017-12-24 DIAGNOSIS — M8580 Other specified disorders of bone density and structure, unspecified site: Secondary | ICD-10-CM | POA: Diagnosis not present

## 2017-12-24 DIAGNOSIS — E162 Hypoglycemia, unspecified: Secondary | ICD-10-CM | POA: Diagnosis not present

## 2017-12-24 DIAGNOSIS — Z1331 Encounter for screening for depression: Secondary | ICD-10-CM | POA: Diagnosis not present

## 2018-03-14 ENCOUNTER — Ambulatory Visit (INDEPENDENT_AMBULATORY_CARE_PROVIDER_SITE_OTHER): Payer: BLUE CROSS/BLUE SHIELD | Admitting: Obstetrics & Gynecology

## 2018-03-14 ENCOUNTER — Encounter: Payer: Self-pay | Admitting: Obstetrics & Gynecology

## 2018-03-14 VITALS — BP 134/80 | Temp 99.2°F

## 2018-03-14 DIAGNOSIS — D071 Carcinoma in situ of vulva: Secondary | ICD-10-CM

## 2018-03-14 DIAGNOSIS — F17290 Nicotine dependence, other tobacco product, uncomplicated: Secondary | ICD-10-CM

## 2018-03-14 MED ORDER — VARENICLINE TARTRATE 1 MG PO TABS: 1.0000 mg | ORAL_TABLET | Freq: Two times a day (BID) | ORAL | 1 refills | Status: DC

## 2018-03-14 MED ORDER — VARENICLINE TARTRATE 0.5 MG X 11 & 1 MG X 42 PO MISC: ORAL | 0 refills | Status: DC

## 2018-03-14 NOTE — Progress Notes (Signed)
    [name] 05/30/1961 [id number]        57 y.o.  G1P1001   RP: H/O VIN 3 for Vulvar Colposcopy  HPI: Pap test on Vaginal vault negative/HPV HR neg on 12/13/2017.  History of VIN 3 post wide excision in 2011.  She had a recurrence of the VIN 3 in 2013 for which an excision was done showing negative margins.  Negative vulvar colposcopy in June 2018.  Patient is a smoker.  Tried to quit on nicotine patch, but not successful.  Motivated to quit smoking, would like to try with Chantix.  Good moods, no depressive Sxs.   OB History  Gravida Para Term Preterm AB Living  1 1 1     1   SAB TAB Ectopic Multiple Live Births          1    # Outcome Date GA Lbr Len/2nd Weight Sex Delivery Anes PTL Lv  1 Term     F Vag-Spont  N LIV    Past medical history,surgical history, problem list, medications, allergies, family history and social history were all reviewed and documented in the EPIC chart.   Directed ROS with pertinent positives and negatives documented in the history of present illness/assessment and plan.  Exam:  Vitals:   03/14/18 1555  BP: 134/80  Temp: 99.2 F (37.3 C)  TempSrc: Oral   General appearance:  Normal  Colposcopy Procedure Note [name] 03/14/2018  Indications:  Procedure Details  The risks and benefits of the procedure and Verbal informed consent obtained  Vulva and perianal area swabbed x 3 with acetic acid solution.  Findings:  Vulvar colposcopy:  Mild AW, minimal punctation at posterior left>right vulva. Physical Exam  Genitourinary:       Perirectal colposcopy:  Negative  The cervix was sprayed with Hurricane before performing the cervical biopsies.  Specimens: None  Complications: None . Plan:  Repeat Colpo of vulva in 6 months.   Assessment/Plan:  57 y.o. G1P1001   1. Vulvar intraepithelial neoplasia (VIN) grade 3 Very mild acetowhite areas with some punctation at the left posterior vulva and even milder at the  right posterior vulva.  No more than VIN 1 on colposcopy exam.  Decision not to biopsy today, but will repeat Vulvar Colposcopy in 6 months.  2. Cigar smoker motivated to quit Motivated to quit smoking, not successful on the nicotine patch.  No depressive symptoms.  Decision to start Chantix.  Usage, risks and benefits reviewed with patient.  Patient has taken Chantix in the past.    Other orders - varenicline (CHANTIX STARTING MONTH PAK) 0.5 MG X 11 & 1 MG X 42 tablet; Take one 0.5 mg tablet by mouth daily for 3 days, then one 0.5 mg tablet twice daily for 4 days, then one 1 mg tablet twice daily. - varenicline (CHANTIX CONTINUING MONTH PAK) 1 MG tablet; Take 1 tablet (1 mg total) by mouth 2 (two) times daily.  Counseling on above issues and coordination of care more than 50% for 15 minutes.   [name], 4:30 PM 03/14/2018

## 2018-03-17 ENCOUNTER — Encounter: Payer: Self-pay | Admitting: Obstetrics & Gynecology

## 2018-03-17 NOTE — Patient Instructions (Signed)
1. Vulvar intraepithelial neoplasia (VIN) grade 3 Very mild acetowhite areas with some punctation at the left posterior vulva and even milder at the right posterior vulva.  No more than VIN 1 on colposcopy exam.  Decision not to biopsy today, but will repeat Vulvar Colposcopy in 6 months.  2. Cigar smoker motivated to quit Motivated to quit smoking, not successful on the nicotine patch.  No depressive symptoms.  Decision to start Chantix.  Usage, risks and benefits reviewed with patient.  Patient has taken Chantix in the past.    Other orders - varenicline (CHANTIX STARTING MONTH PAK) 0.5 MG X 11 & 1 MG X 42 tablet; Take one 0.5 mg tablet by mouth daily for 3 days, then one 0.5 mg tablet twice daily for 4 days, then one 1 mg tablet twice daily. - varenicline (CHANTIX CONTINUING MONTH PAK) 1 MG tablet; Take 1 tablet (1 mg total) by mouth 2 (two) times daily.  Joelene Millin, good seeing you today!  I will see you in 6 months for repeat vulvar colposcopy.

## 2018-04-09 ENCOUNTER — Other Ambulatory Visit: Payer: Self-pay | Admitting: Obstetrics & Gynecology

## 2018-05-11 ENCOUNTER — Other Ambulatory Visit: Payer: Self-pay | Admitting: Obstetrics & Gynecology

## 2018-09-12 ENCOUNTER — Encounter: Payer: Self-pay | Admitting: Obstetrics & Gynecology

## 2018-09-12 ENCOUNTER — Ambulatory Visit (INDEPENDENT_AMBULATORY_CARE_PROVIDER_SITE_OTHER): Payer: BLUE CROSS/BLUE SHIELD | Admitting: Obstetrics & Gynecology

## 2018-09-12 VITALS — BP 130/78

## 2018-09-12 DIAGNOSIS — Z23 Encounter for immunization: Secondary | ICD-10-CM | POA: Diagnosis not present

## 2018-09-12 DIAGNOSIS — Z86008 Personal history of in-situ neoplasm of other site: Secondary | ICD-10-CM | POA: Diagnosis not present

## 2018-09-12 DIAGNOSIS — N900 Mild vulvar dysplasia: Secondary | ICD-10-CM | POA: Diagnosis not present

## 2018-09-12 DIAGNOSIS — D071 Carcinoma in situ of vulva: Secondary | ICD-10-CM

## 2018-09-12 NOTE — Patient Instructions (Signed)
1. Severe vulvar dysplasia, histologically confirmed History of VIN 3 in 2011 and recurrence in 2013 with excision and margins negative.  Last vulvar colposcopy in June 2019 showed very mild acetowhite at the posterior vulva bilaterally, but no evidence of dysplasia and no biopsy was done.  Vulvar colposcopy today shows similar results, maximum VIN 1.  Decision not to take any biopsy and to repeat vulvar colposcopy at patient's annual gynecologic exam in April 2020.  If similar or any progression will proceed with vulvar biopsies at that time.  [name], it was a pleasure seeing you today!

## 2018-09-12 NOTE — Addendum Note (Signed)
Addended by: Thurnell Garbe A on: 09/12/2018 03:40 PM   Modules accepted: Orders

## 2018-09-12 NOTE — Progress Notes (Signed)
    [name] 1961-06-08 [id number]        57 y.o.  G1P1001   RP: H/O VIN 3 for Vulvar Colposcopy  HPI:  Negative vulvar colposcopy in June 2018. Vulvar colposcopy 03/2018 with very mild AW, no evidence of dysplasia, no Bx done.  Pap test on Vaginal vault negative/HPV HR neg on 12/13/2017.  History of VIN 3 post wide excision in 2011. She had a recurrence of the VIN 3 in 2013 for which an excision was done showing negative margins. Patient is a smoker. Tried to quit on nicotine patch.  Motivated to quit smoking, but not successful so far.  Good moods, no depressive Sxs.   OB History  Gravida Para Term Preterm AB Living  1 1 1     1   SAB TAB Ectopic Multiple Live Births          1    # Outcome Date GA Lbr Len/2nd Weight Sex Delivery Anes PTL Lv  1 Term     F Vag-Spont  N LIV    Past medical history,surgical history, problem list, medications, allergies, family history and social history were all reviewed and documented in the EPIC chart.   Directed ROS with pertinent positives and negatives documented in the history of present illness/assessment and plan.  Exam:  Vitals:   09/12/18 1500  BP: 130/78   General appearance:  Normal  Colposcopy Procedure Note AVIELLA DISBROW 09/12/2018  Indications: H/O VIN 3  Procedure Details  The risks and benefits of the procedure and Verbal informed consent obtained.  Speculum placed in vagina and excellent visualization of cervix achieved, cervix swabbed x 3 with acetic acid solution.  Findings:  Cervix colposcopy: Not done  Vaginal colposcopy: Not done  Vulvar colposcopy: Physical Exam  Genitourinary:       Perirectal colposcopy: Negative  The cervix was sprayed with Hurricane before performing the cervical biopsies.  Specimens:  None  Complications: None . Plan: Repeat vulvar colposcopy at Annual Gyn visit 01/2019.   Assessment/Plan:  57 y.o. G1P1001   1. Severe vulvar dysplasia, histologically  confirmed History of VIN 3 in 2011 and recurrence in 2013 with excision and margins negative.  Last vulvar colposcopy in June 2019 showed very mild acetowhite at the posterior vulva bilaterally, but no evidence of dysplasia and no biopsy was done.  Vulvar colposcopy today shows similar results, maximum VIN 1.  Decision not to take any biopsy and to repeat vulvar colposcopy at patient's annual gynecologic exam in April 2020.  If similar or any progression will proceed with vulvar biopsies at that time.  Counseling on above issues and coordination of care more than 50% for 15 minutes.  [name], 3:10 PM 09/12/2018

## 2018-12-16 DIAGNOSIS — C50919 Malignant neoplasm of unspecified site of unspecified female breast: Secondary | ICD-10-CM | POA: Diagnosis not present

## 2018-12-16 DIAGNOSIS — Z90710 Acquired absence of both cervix and uterus: Secondary | ICD-10-CM | POA: Diagnosis not present

## 2018-12-16 DIAGNOSIS — Z853 Personal history of malignant neoplasm of breast: Secondary | ICD-10-CM | POA: Diagnosis not present

## 2018-12-16 DIAGNOSIS — M8589 Other specified disorders of bone density and structure, multiple sites: Secondary | ICD-10-CM | POA: Diagnosis not present

## 2018-12-16 DIAGNOSIS — Z1231 Encounter for screening mammogram for malignant neoplasm of breast: Secondary | ICD-10-CM | POA: Diagnosis not present

## 2018-12-16 DIAGNOSIS — Z87891 Personal history of nicotine dependence: Secondary | ICD-10-CM | POA: Diagnosis not present

## 2018-12-19 ENCOUNTER — Encounter: Payer: Self-pay | Admitting: Anesthesiology

## 2019-01-02 ENCOUNTER — Other Ambulatory Visit: Payer: Self-pay | Admitting: Obstetrics & Gynecology

## 2019-01-02 NOTE — Telephone Encounter (Signed)
annual scheduled on 01/07/19

## 2019-01-07 ENCOUNTER — Encounter: Payer: BLUE CROSS/BLUE SHIELD | Admitting: Obstetrics & Gynecology

## 2019-01-30 ENCOUNTER — Other Ambulatory Visit: Payer: Self-pay | Admitting: Obstetrics & Gynecology

## 2019-02-19 DIAGNOSIS — Z0289 Encounter for other administrative examinations: Secondary | ICD-10-CM

## 2019-03-06 ENCOUNTER — Other Ambulatory Visit: Payer: Self-pay

## 2019-03-07 ENCOUNTER — Ambulatory Visit (INDEPENDENT_AMBULATORY_CARE_PROVIDER_SITE_OTHER): Payer: BC Managed Care – PPO | Admitting: Obstetrics & Gynecology

## 2019-03-07 ENCOUNTER — Encounter: Payer: Self-pay | Admitting: Obstetrics & Gynecology

## 2019-03-07 ENCOUNTER — Other Ambulatory Visit: Payer: Self-pay

## 2019-03-07 VITALS — BP 126/78 | Ht 64.0 in | Wt 124.0 lb

## 2019-03-07 DIAGNOSIS — Z1272 Encounter for screening for malignant neoplasm of vagina: Secondary | ICD-10-CM

## 2019-03-07 DIAGNOSIS — N901 Moderate vulvar dysplasia: Secondary | ICD-10-CM | POA: Diagnosis not present

## 2019-03-07 DIAGNOSIS — E781 Pure hyperglyceridemia: Secondary | ICD-10-CM | POA: Diagnosis not present

## 2019-03-07 DIAGNOSIS — Z780 Asymptomatic menopausal state: Secondary | ICD-10-CM | POA: Diagnosis not present

## 2019-03-07 DIAGNOSIS — F3341 Major depressive disorder, recurrent, in partial remission: Secondary | ICD-10-CM | POA: Diagnosis not present

## 2019-03-07 DIAGNOSIS — C50912 Malignant neoplasm of unspecified site of left female breast: Secondary | ICD-10-CM

## 2019-03-07 DIAGNOSIS — Z01419 Encounter for gynecological examination (general) (routine) without abnormal findings: Secondary | ICD-10-CM | POA: Diagnosis not present

## 2019-03-07 DIAGNOSIS — Z853 Personal history of malignant neoplasm of breast: Secondary | ICD-10-CM | POA: Diagnosis not present

## 2019-03-07 DIAGNOSIS — M8589 Other specified disorders of bone density and structure, multiple sites: Secondary | ICD-10-CM | POA: Diagnosis not present

## 2019-03-07 DIAGNOSIS — Z170 Estrogen receptor positive status [ER+]: Secondary | ICD-10-CM

## 2019-03-07 MED ORDER — BUPROPION HCL ER (XL) 150 MG PO TB24: 150.0000 mg | ORAL_TABLET | Freq: Every day | ORAL | 4 refills | Status: DC

## 2019-03-07 NOTE — Progress Notes (Signed)
[name] 11/14/1960 [id number]   History:    58 y.o. 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  RP:  Established patient presenting for annual gyn exam   HPI: Status post LAVH/BSO in 2006.  Well on no hormone replacement therapy.  History of left breast cancer in 2004.  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  She had lumpectomy followed by radiation and then tamoxifen for 5 years.  Breasts normal now.  Recent screening mammogram 12/2018 was benign.  History of VIN 3 post wide excision in 2011.  She had a recurrence of the VIN 3 in 2013 for which an excision was done showing negative margins.  Last Colpo of vulva 09/2018 Very mild AW, no Bx done. Patient is a smoker, has successfully decreased consumption.  Patient is having no pelvic pain.  She is sexually active with her husband without difficulty or pain.  BMI 21.28. 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  Past medical history,surgical history, family history and social history were all reviewed and documented in the EPIC chart.  Gynecologic History No LMP recorded. Patient has had a hysterectomy. Contraception: status post hysterectomy Last Pap: 11/2017. Results were: Negative/HPV HR neg Last mammogram: 12/2018. Results were: Benign Bone Density: 12/2018 Osteopenia T-Score -1.7 Colonoscopy: 2012  Obstetric History OB History  Gravida Para Term Preterm AB Living  _0 SAB TAB Ectopic Multiple Live Births          1    # Outcome Date GA Lbr Len/2nd Weight Sex Delivery Anes PTL Lv  1 Term     F Vag-Spont  N LIV     ROS: A ROS was performed and pertinent positives and negatives are included in the history.  GENERAL: No fevers or chills. HEENT: No change in vision, no earache, sore throat or sinus congestion. NECK: No pain or stiffness. CARDIOVASCULAR: No chest pain or pressure. No palpitations. PULMONARY: No shortness of breath, cough or wheeze. GASTROINTESTINAL: No abdominal pain,  nausea, vomiting or diarrhea, melena or bright red blood per rectum. GENITOURINARY: No urinary frequency, urgency, hesitancy or dysuria. MUSCULOSKELETAL: No joint or muscle pain, no back pain, no recent trauma. DERMATOLOGIC: No rash, no itching, no lesions. ENDOCRINE: No polyuria, polydipsia, no heat or cold intolerance. No recent change in weight. HEMATOLOGICAL: No anemia or easy bruising or bleeding. NEUROLOGIC: No headache, seizures, numbness, tingling or weakness. PSYCHIATRIC: No depression, no loss of interest in normal activity or change in sleep pattern.     Exam:   BP 126/78   Ht _1  (1.626 m)   Wt 124 lb (56.2 kg)   BMI 21.28 kg/m   Body mass index is 21.28 kg/m.  General appearance : Well developed well nourished female. No acute distress HEENT: Eyes: no retinal hemorrhage or exudates,  Neck supple, trachea midline, no carotid bruits, no thyroidmegaly Lungs: Clear to auscultation, no rhonchi or wheezes, or rib retractions  Heart: Regular rate and rhythm, no murmurs or gallops Breast:Examined in sitting and supine position were symmetrical in appearance, no palpable masses or tenderness,  no skin retraction, no nipple inversion, no nipple discharge, no skin discoloration, no axillary or supraclavicular lymphadenopathy Abdomen: no palpable masses or tenderness, no rebound or guarding Extremities: no edema or skin discoloration or tenderness  Pelvic: Vulva: Normal             Vagina: No gross lesions or  discharge.  Pap reflex done.  Cervix/Uterus absent  Adnexa  Without masses or tenderness  Anus: Normal  Colposcopy Procedure Note [name] 03/07/2019  Indications: H/O VIN 3  Procedure Details  The risks and benefits of the procedure and Verbal informed consent obtained.  The vulva is swabbed x 3 with acetic acid solution.   Findings:  Vulvar colposcopy:   Physical Exam Genitourinary:      Perirectal colposcopy: Normal  The cervix was sprayed with  Hurricane before performing the cervical biopsies.  Specimens: None  Complications: None . Plan:  Repeat colposcopy of the vulva in 1 year   Assessment/Plan:  58 y.o. female for annual exam   1. Encounter for Papanicolaou smear of vagina as part of routine gynecological examination Normal gynecologic exam except for mild acetowhite on posterior vulva.  Pap reflex done.  Breast exam normal.  Good body mass index at 21.28.  Continue to be physically active and eat a healthy diet.  Fasting health labs here today.  Colonoscopy 2012. - CBC - Comp Met (CMET) - Lipid panel - TSH - VITAMIN D 25 Hydroxy (Vit-D Deficiency, Fractures)  2. Postmenopause Well on no hormone replacement therapy.  3. VIN III (vulvar intraepithelial neoplasia III) History of wide excision of the vulva x2.  Last wide excision in 2013 with negative margins.  Stable finding on vulvar colposcopy today with very mild acetowhite at the posterior vulva.  No biopsy taken.  We will repeat a vulvar colposcopy in a year.  4. Recurrent major depressive disorder, in partial remission (HCC) Stable on Wellbutrin XL.  No contraindication to continue with treatment.  Prescription sent to pharmacy.  5. Malignant neoplasm of left breast, stage 1, estrogen receptor positive ([place]) Left breast cancer in 2004.  Other orders - buPROPion (WELLBUTRIN XL) 150 MG 24 hr tablet; Take 1 tablet (150 mg total) by mouth daily.  [name], 3:40 PM 03/07/2019

## 2019-03-08 ENCOUNTER — Encounter: Payer: Self-pay | Admitting: Obstetrics & Gynecology

## 2019-03-08 LAB — CBC
HCT: 37.6 % (ref 35.0–45.0)
Hemoglobin: 12.5 g/dL (ref 11.7–15.5)
MCH: 31.9 pg (ref 27.0–33.0)
MCHC: 33.2 g/dL (ref 32.0–36.0)
MCV: 95.9 fL (ref 80.0–100.0)
MPV: 12.6 fL — ABNORMAL HIGH (ref 7.5–12.5)
Platelets: 285 10*3/uL (ref 140–400)
RBC: 3.92 10*6/uL (ref 3.80–5.10)
RDW: 12.5 % (ref 11.0–15.0)
WBC: 8.8 10*3/uL (ref 3.8–10.8)

## 2019-03-08 LAB — COMPREHENSIVE METABOLIC PANEL
AG Ratio: 1.7 (calc) (ref 1.0–2.5)
ALT: 8 U/L (ref 6–29)
AST: 13 U/L (ref 10–35)
Albumin: 4.1 g/dL (ref 3.6–5.1)
Alkaline phosphatase (APISO): 61 U/L (ref 37–153)
BUN/Creatinine Ratio: 8 (calc) (ref 6–22)
BUN: 9 mg/dL (ref 7–25)
CO2: 33 mmol/L — ABNORMAL HIGH (ref 20–32)
Calcium: 9.7 mg/dL (ref 8.6–10.4)
Chloride: 104 mmol/L (ref 98–110)
Creat: 1.08 mg/dL — ABNORMAL HIGH (ref 0.50–0.99)
Globulin: 2.4 g/dL (calc) (ref 1.9–3.7)
Glucose, Bld: 72 mg/dL (ref 65–99)
Potassium: 3.6 mmol/L (ref 3.5–5.3)
Sodium: 141 mmol/L (ref 135–146)
Total Bilirubin: 0.3 mg/dL (ref 0.2–1.2)
Total Protein: 6.5 g/dL (ref 6.1–8.1)

## 2019-03-08 LAB — LIPID PANEL
Cholesterol: 222 mg/dL — ABNORMAL HIGH (ref <200)
HDL: 41 mg/dL — ABNORMAL LOW (ref >=50)
LDL Cholesterol (Calc): 152 mg/dL (calc) — ABNORMAL HIGH
Non-HDL Cholesterol (Calc): 181 mg/dL (calc) — ABNORMAL HIGH (ref <130)
Total CHOL/HDL Ratio: 5.4 (calc) — ABNORMAL HIGH (ref <5.0)
Triglycerides: 158 mg/dL — ABNORMAL HIGH (ref <150)

## 2019-03-08 LAB — TSH: TSH: 1.76 mIU/L (ref 0.40–4.50)

## 2019-03-08 LAB — VITAMIN D 25 HYDROXY (VIT D DEFICIENCY, FRACTURES): Vit D, 25-Hydroxy: 42 ng/mL (ref 30–100)

## 2019-03-08 NOTE — Patient Instructions (Signed)
1. Encounter for Papanicolaou smear of vagina as part of routine gynecological examination Normal gynecologic exam except for mild acetowhite on posterior vulva.  Pap reflex done.  Breast exam normal.  Good body mass index at 21.28.  Continue to be physically active and eat a healthy diet.  Fasting health labs here today.  Colonoscopy 2012. - CBC - Comp Met (CMET) - Lipid panel - TSH - VITAMIN D 25 Hydroxy (Vit-D Deficiency, Fractures)  2. Postmenopause Well on no hormone replacement therapy.  3. VIN III (vulvar intraepithelial neoplasia III) History of wide excision of the vulva x2.  Last wide excision in 2013 with negative margins.  Stable finding on vulvar colposcopy today with very mild acetowhite at the posterior vulva.  No biopsy taken.  We will repeat a vulvar colposcopy in a year.  4. Recurrent major depressive disorder, in partial remission (HCC) Stable on Wellbutrin XL.  No contraindication to continue with treatment.  Prescription sent to pharmacy.  5. Malignant neoplasm of left breast, stage 1, estrogen receptor positive ([place]) Left breast cancer in 2004.  Other orders - buPROPion (WELLBUTRIN XL) 150 MG 24 hr tablet; Take 1 tablet (150 mg total) by mouth daily.  Suad, it was a pleasure seeing you today!  I will inform you of your results as soon as they are available.

## 2019-03-10 DIAGNOSIS — Z01419 Encounter for gynecological examination (general) (routine) without abnormal findings: Secondary | ICD-10-CM | POA: Diagnosis not present

## 2019-03-10 NOTE — Addendum Note (Signed)
Addended by: Thurnell Garbe A on: 03/10/2019 09:53 AM   Modules accepted: Orders

## 2019-03-12 LAB — PAP IG W/ RFLX HPV ASCU

## 2019-06-05 DIAGNOSIS — Z01818 Encounter for other preprocedural examination: Secondary | ICD-10-CM | POA: Diagnosis not present

## 2019-07-01 ENCOUNTER — Encounter: Payer: Self-pay | Admitting: Gynecology

## 2019-09-21 DIAGNOSIS — Z20828 Contact with and (suspected) exposure to other viral communicable diseases: Secondary | ICD-10-CM | POA: Diagnosis not present

## 2019-09-21 DIAGNOSIS — R0981 Nasal congestion: Secondary | ICD-10-CM | POA: Diagnosis not present

## 2020-01-05 DIAGNOSIS — Z23 Encounter for immunization: Secondary | ICD-10-CM | POA: Diagnosis not present

## 2020-01-26 DIAGNOSIS — Z23 Encounter for immunization: Secondary | ICD-10-CM | POA: Diagnosis not present

## 2020-03-08 ENCOUNTER — Other Ambulatory Visit: Payer: Self-pay | Admitting: Obstetrics & Gynecology

## 2020-03-08 NOTE — Telephone Encounter (Signed)
Due for annual exam. Sent [name] a message to please call and schedule CE. I will refill the 90 days supply once scheduled.

## 2020-03-08 NOTE — Telephone Encounter (Signed)
[name] left message for patient to call.

## 2020-03-16 ENCOUNTER — Encounter: Payer: Self-pay | Admitting: Obstetrics & Gynecology

## 2020-05-20 ENCOUNTER — Other Ambulatory Visit: Payer: Self-pay

## 2020-05-20 ENCOUNTER — Ambulatory Visit (INDEPENDENT_AMBULATORY_CARE_PROVIDER_SITE_OTHER): Payer: BC Managed Care – PPO | Admitting: Obstetrics & Gynecology

## 2020-05-20 ENCOUNTER — Encounter: Payer: Self-pay | Admitting: Obstetrics & Gynecology

## 2020-05-20 VITALS — BP 120/78 | Ht 63.5 in | Wt 119.0 lb

## 2020-05-20 DIAGNOSIS — Z1272 Encounter for screening for malignant neoplasm of vagina: Secondary | ICD-10-CM | POA: Diagnosis not present

## 2020-05-20 DIAGNOSIS — Z8544 Personal history of malignant neoplasm of other female genital organs: Secondary | ICD-10-CM

## 2020-05-20 DIAGNOSIS — E785 Hyperlipidemia, unspecified: Secondary | ICD-10-CM | POA: Diagnosis not present

## 2020-05-20 DIAGNOSIS — Z1329 Encounter for screening for other suspected endocrine disorder: Secondary | ICD-10-CM | POA: Diagnosis not present

## 2020-05-20 DIAGNOSIS — Z170 Estrogen receptor positive status [ER+]: Secondary | ICD-10-CM

## 2020-05-20 DIAGNOSIS — D071 Carcinoma in situ of vulva: Secondary | ICD-10-CM

## 2020-05-20 DIAGNOSIS — Z01419 Encounter for gynecological examination (general) (routine) without abnormal findings: Secondary | ICD-10-CM

## 2020-05-20 DIAGNOSIS — Z90710 Acquired absence of both cervix and uterus: Secondary | ICD-10-CM

## 2020-05-20 DIAGNOSIS — C50912 Malignant neoplasm of unspecified site of left female breast: Secondary | ICD-10-CM

## 2020-05-20 DIAGNOSIS — Z780 Asymptomatic menopausal state: Secondary | ICD-10-CM

## 2020-05-20 DIAGNOSIS — M8589 Other specified disorders of bone density and structure, multiple sites: Secondary | ICD-10-CM

## 2020-05-20 DIAGNOSIS — Z853 Personal history of malignant neoplasm of breast: Secondary | ICD-10-CM | POA: Diagnosis not present

## 2020-05-20 DIAGNOSIS — F3341 Major depressive disorder, recurrent, in partial remission: Secondary | ICD-10-CM

## 2020-05-20 MED ORDER — BUPROPION HCL ER (XL) 150 MG PO TB24: 150.0000 mg | ORAL_TABLET | Freq: Every day | ORAL | 4 refills | Status: DC

## 2020-05-20 NOTE — Progress Notes (Signed)
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  RP:  Established patient presenting for annual gyn exam   HPI: Status post LAVH/BSO in2006.Well on no hormone replacement therapy. History of left breast cancer in 2004. 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 She had lumpectomy followed by radiation and then tamoxifen for 5 years. Breasts normal now. Recent screening mammogram 12/2019 was benign. History of VIN 3 post wide excision in 2011. She had a recurrence of the VIN 3 in 2013 for which an excision was done showing negative margins. Last Colpo of vulva 09/2018 Very mild AW, no Bx done. Patient is having no pelvic pain. She is sexually active with her husband without difficulty or pain. BMI 20.75. 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  Past medical history,surgical history, family history and social history were all reviewed and documented in the EPIC chart.  Gynecologic History No LMP recorded. Patient has had a hysterectomy.  Obstetric History OB History  Gravida Para Term Preterm AB Living  _0 SAB TAB Ectopic Multiple Live Births          1    # Outcome Date GA Lbr Len/2nd Weight Sex Delivery Anes PTL Lv  1 Term     F Vag-Spont  N LIV     ROS: A ROS was performed and pertinent positives and negatives are included in the history.  GENERAL: No fevers or chills. HEENT: No change in vision, no earache, sore throat or sinus congestion. NECK: No pain or stiffness. CARDIOVASCULAR: No chest pain or pressure. No palpitations. PULMONARY: No shortness of breath, cough or wheeze. GASTROINTESTINAL: No abdominal pain, nausea, vomiting or diarrhea, melena or bright red blood per rectum. GENITOURINARY: No urinary frequency, urgency, hesitancy or dysuria. MUSCULOSKELETAL: No joint or muscle pain, no back pain, no recent trauma. DERMATOLOGIC: No rash, no itching, no lesions.  ENDOCRINE: No polyuria, polydipsia, no heat or cold intolerance. No recent change in weight. HEMATOLOGICAL: No anemia or easy bruising or bleeding. NEUROLOGIC: No headache, seizures, numbness, tingling or weakness. PSYCHIATRIC: No depression, no loss of interest in normal activity or change in sleep pattern.     Exam:   BP 120/78   Ht 5' 3.5" (1.613 m)   Wt 119 lb (54 kg)   BMI 20.75 kg/m   Body mass index is 20.75 kg/m.  General appearance : Well developed well nourished female. No acute distress HEENT: Eyes: no retinal hemorrhage or exudates,  Neck supple, trachea midline, no carotid bruits, no thyroidmegaly Lungs: Clear to auscultation, no rhonchi or wheezes, or rib retractions  Heart: Regular rate and rhythm, no murmurs or gallops Breast:Examined in sitting and supine position were symmetrical in appearance, no palpable masses or tenderness,  no skin retraction, no nipple inversion, no nipple discharge, no skin discoloration, no axillary or supraclavicular lymphadenopathy Abdomen: no palpable masses or tenderness, no rebound or guarding Extremities: no edema or skin discoloration or tenderness  Pelvic: Vulva: Normal             Vagina: No gross lesions or discharge.  Pap reflex done on the vaginal vault.  Cervix/Uterus absent  Adnexa  Without masses or tenderness  Anus: Normal   Assessment/Plan:  59 y.o. female for annual exam   1. Encounter for Papanicolaou smear of vagina as part of routine gynecological examination Gynecologic  exam status post LAVH with BSO.  Pap reflex done on the vaginal vault.  Breast exam normal.  Screening mammogram June 2021 was negative.  Colonoscopy 2012.  Health labs here today. - CBC - Comp Met (CMET) - Lipid panel - TSH - VITAMIN D 25 Hydroxy (Vit-D Deficiency, Fractures)  2. S/P laparoscopic assisted vaginal hysterectomy (LAVH)  3. Severe vulvar dysplasia, histologically confirmed Vulva normal on exam.  Pap reflex done on the vaginal  vault.  4. Postmenopause Well on no hormone replacement therapy.  5. Osteopenia of multiple sites Bone density March 2020 showed osteopenia.  We will repeat a bone density March 2022.  Vitamin D supplements, calcium intake of 1200 to 1500 mg daily.  Regular weightbearing physical activity is recommended.  6. Recurrent major depressive disorder, in partial remission (HCC) Stable on Wellbutrin XL.  No contraindication to continue.  Prescription sent to pharmacy.  7. Malignant neoplasm of left breast, stage 1, estrogen receptor positive (HCC)  Other orders - buPROPion (WELLBUTRIN XL) 150 MG 24 hr tablet; Take 1 tablet (150 mg total) by mouth daily.  [name], 3:30 PM 05/20/2020

## 2020-05-21 LAB — CBC
HCT: 40.5 % (ref 35.0–45.0)
Hemoglobin: 13.3 g/dL (ref 11.7–15.5)
MCH: 32.8 pg (ref 27.0–33.0)
MCHC: 32.8 g/dL (ref 32.0–36.0)
MCV: 99.8 fL (ref 80.0–100.0)
MPV: 12.8 fL — ABNORMAL HIGH (ref 7.5–12.5)
Platelets: 279 10*3/uL (ref 140–400)
RBC: 4.06 10*6/uL (ref 3.80–5.10)
RDW: 12.5 % (ref 11.0–15.0)
WBC: 9.4 10*3/uL (ref 3.8–10.8)

## 2020-05-21 LAB — COMPREHENSIVE METABOLIC PANEL
AG Ratio: 1.8 (calc) (ref 1.0–2.5)
ALT: 8 U/L (ref 6–29)
AST: 13 U/L (ref 10–35)
Albumin: 4.1 g/dL (ref 3.6–5.1)
Alkaline phosphatase (APISO): 75 U/L (ref 37–153)
BUN/Creatinine Ratio: 9 (calc) (ref 6–22)
BUN: 10 mg/dL (ref 7–25)
CO2: 28 mmol/L (ref 20–32)
Calcium: 9.9 mg/dL (ref 8.6–10.4)
Chloride: 104 mmol/L (ref 98–110)
Creat: 1.1 mg/dL — ABNORMAL HIGH (ref 0.50–0.99)
Globulin: 2.3 g/dL (calc) (ref 1.9–3.7)
Glucose, Bld: 84 mg/dL (ref 65–99)
Potassium: 4.2 mmol/L (ref 3.5–5.3)
Sodium: 141 mmol/L (ref 135–146)
Total Bilirubin: 0.4 mg/dL (ref 0.2–1.2)
Total Protein: 6.4 g/dL (ref 6.1–8.1)

## 2020-05-21 LAB — LIPID PANEL
Cholesterol: 217 mg/dL — ABNORMAL HIGH (ref <200)
HDL: 37 mg/dL — ABNORMAL LOW (ref >=50)
LDL Cholesterol (Calc): 145 mg/dL (calc) — ABNORMAL HIGH
Non-HDL Cholesterol (Calc): 180 mg/dL (calc) — ABNORMAL HIGH (ref <130)
Total CHOL/HDL Ratio: 5.9 (calc) — ABNORMAL HIGH (ref <5.0)
Triglycerides: 201 mg/dL — ABNORMAL HIGH (ref <150)

## 2020-05-21 LAB — PAP IG W/ RFLX HPV ASCU

## 2020-05-21 LAB — TSH: TSH: 1.25 mIU/L (ref 0.40–4.50)

## 2020-05-21 LAB — VITAMIN D 25 HYDROXY (VIT D DEFICIENCY, FRACTURES): Vit D, 25-Hydroxy: 54 ng/mL (ref 30–100)

## 2020-05-25 NOTE — Telephone Encounter (Signed)
Spoke with patient and informed her. °

## 2020-06-17 DIAGNOSIS — E782 Mixed hyperlipidemia: Secondary | ICD-10-CM | POA: Diagnosis not present

## 2020-06-17 DIAGNOSIS — R7989 Other specified abnormal findings of blood chemistry: Secondary | ICD-10-CM | POA: Diagnosis not present

## 2020-06-17 DIAGNOSIS — F17200 Nicotine dependence, unspecified, uncomplicated: Secondary | ICD-10-CM | POA: Diagnosis not present

## 2020-06-17 DIAGNOSIS — I700 Atherosclerosis of aorta: Secondary | ICD-10-CM | POA: Diagnosis not present

## 2020-10-14 DIAGNOSIS — U071 COVID-19: Secondary | ICD-10-CM | POA: Diagnosis not present

## 2020-10-14 DIAGNOSIS — Z20822 Contact with and (suspected) exposure to covid-19: Secondary | ICD-10-CM | POA: Diagnosis not present

## 2021-03-22 DIAGNOSIS — Z1231 Encounter for screening mammogram for malignant neoplasm of breast: Secondary | ICD-10-CM | POA: Diagnosis not present

## 2021-03-22 DIAGNOSIS — M85851 Other specified disorders of bone density and structure, right thigh: Secondary | ICD-10-CM | POA: Diagnosis not present

## 2021-03-25 ENCOUNTER — Encounter: Payer: Self-pay | Admitting: Anesthesiology

## 2021-04-01 ENCOUNTER — Encounter: Payer: Self-pay | Admitting: Obstetrics & Gynecology

## 2021-06-13 ENCOUNTER — Other Ambulatory Visit: Payer: Self-pay

## 2021-06-13 ENCOUNTER — Other Ambulatory Visit (HOSPITAL_COMMUNITY)
Admission: RE | Admit: 2021-06-13 | Discharge: 2021-06-13 | Disposition: A | Discharge status: Home / Self Care | Payer: BC Managed Care – PPO | Source: Ambulatory Visit | Attending: Obstetrics & Gynecology | Admitting: Obstetrics & Gynecology

## 2021-06-13 ENCOUNTER — Encounter: Payer: Self-pay | Admitting: Obstetrics & Gynecology

## 2021-06-13 ENCOUNTER — Ambulatory Visit (INDEPENDENT_AMBULATORY_CARE_PROVIDER_SITE_OTHER): Payer: BC Managed Care – PPO | Admitting: Obstetrics & Gynecology

## 2021-06-13 VITALS — BP 116/82 | HR 76 | Resp 18 | Ht 64.8 in | Wt 130.4 lb

## 2021-06-13 DIAGNOSIS — Z01419 Encounter for gynecological examination (general) (routine) without abnormal findings: Secondary | ICD-10-CM | POA: Diagnosis not present

## 2021-06-13 DIAGNOSIS — Z780 Asymptomatic menopausal state: Secondary | ICD-10-CM | POA: Diagnosis not present

## 2021-06-13 DIAGNOSIS — Z8544 Personal history of malignant neoplasm of other female genital organs: Secondary | ICD-10-CM

## 2021-06-13 DIAGNOSIS — D071 Carcinoma in situ of vulva: Secondary | ICD-10-CM | POA: Insufficient documentation

## 2021-06-13 DIAGNOSIS — Z1272 Encounter for screening for malignant neoplasm of vagina: Secondary | ICD-10-CM

## 2021-06-13 DIAGNOSIS — Z170 Estrogen receptor positive status [ER+]: Secondary | ICD-10-CM

## 2021-06-13 DIAGNOSIS — M8589 Other specified disorders of bone density and structure, multiple sites: Secondary | ICD-10-CM

## 2021-06-13 DIAGNOSIS — Z90710 Acquired absence of both cervix and uterus: Secondary | ICD-10-CM | POA: Diagnosis not present

## 2021-06-13 DIAGNOSIS — C50912 Malignant neoplasm of unspecified site of left female breast: Secondary | ICD-10-CM

## 2021-06-13 DIAGNOSIS — Z853 Personal history of malignant neoplasm of breast: Secondary | ICD-10-CM

## 2021-06-13 DIAGNOSIS — F3341 Major depressive disorder, recurrent, in partial remission: Secondary | ICD-10-CM

## 2021-06-13 MED ORDER — BUPROPION HCL ER (XL) 150 MG PO TB24
150.0000 mg | ORAL_TABLET | Freq: Every day | ORAL | 4 refills | Status: DC
Start: 2021-06-13 — End: 2022-06-15

## 2021-06-13 NOTE — Progress Notes (Signed)
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   RP:  Established patient presenting for annual gyn exam    HPI: Status post LAVH/BSO in 2006.  Well on no hormone replacement therapy.  History of left breast cancer in 2004.  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  She had lumpectomy followed by radiation and then tamoxifen for 5 years.  Breasts normal now.  Recent screening mammogram 03/2021 was benign.  History of VIN 3 post wide excision in 2011.  She had a recurrence of the VIN 3 in 2013 for which an excision was done showing negative margins.  Last Colpo of vulva 09/2018 Very mild AW, no Bx done. Pap Neg in 2021.  Patient is having no pelvic pain.  She is sexually active with her husband without difficulty or pain.  BMI 21.83. 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  Colono scheduled 08/2021.  BD 04/2021 Osteopenia T-Score -1.7.   Past medical history,surgical history, family history and social history were all reviewed and documented in the EPIC chart.  Gynecologic History No LMP recorded. Patient has had a hysterectomy.  Obstetric History OB History  Gravida Para Term Preterm AB Living  '1 1 1     1  '$ SAB IAB Ectopic Multiple Live Births          1    # Outcome Date GA Lbr Len/2nd Weight Sex Delivery Anes PTL Lv  1 Term     F Vag-Spont  N LIV     ROS: A ROS was performed and pertinent positives and negatives are included in the history.  GENERAL: No fevers or chills. HEENT: No change in vision, no earache, sore throat or sinus congestion. NECK: No pain or stiffness. CARDIOVASCULAR: No chest pain or pressure. No palpitations. PULMONARY: No shortness of breath, cough or wheeze. GASTROINTESTINAL: No abdominal pain, nausea, vomiting or diarrhea, melena or bright red blood per rectum. GENITOURINARY: No urinary frequency, urgency, hesitancy or dysuria. MUSCULOSKELETAL: No joint or muscle pain, no  back pain, no recent trauma. DERMATOLOGIC: No rash, no itching, no lesions. ENDOCRINE: No polyuria, polydipsia, no heat or cold intolerance. No recent change in weight. HEMATOLOGICAL: No anemia or easy bruising or bleeding. NEUROLOGIC: No headache, seizures, numbness, tingling or weakness. PSYCHIATRIC: No depression, no loss of interest in normal activity or change in sleep pattern.     Exam:   BP 116/82 (BP Location: Right Arm, Patient Position: Sitting)   Pulse 76   Resp 18   Ht 5' 4.8" (1.646 m)   Wt 130 lb 6.4 oz (59.1 kg)   BMI 21.83 kg/m   Body mass index is 21.83 kg/m.  General appearance : Well developed well nourished female. No acute distress HEENT: Eyes: no retinal hemorrhage or exudates,  Neck supple, trachea midline, no carotid bruits, no thyroidmegaly Lungs: Clear to auscultation, no rhonchi or wheezes, or rib retractions  Heart: Regular rate and rhythm, no murmurs or gallops Breast:Examined in sitting and supine position were symmetrical in appearance, no palpable masses or tenderness,  no skin retraction, no nipple inversion, no nipple discharge, no skin discoloration, no axillary or supraclavicular lymphadenopathy Abdomen: no palpable masses or tenderness, no rebound or guarding Extremities: no edema or skin discoloration or tenderness  Pelvic: Vulva: Normal             Vagina: No gross lesions or  discharge.  Pap reflex done.  Cervix/Uterus absent  Adnexa  Without masses or tenderness  Anus: Normal   Assessment/Plan:  60 y.o. female for annual exam   1. Encounter for Papanicolaou smear of vagina as part of routine gynecological examination Gynecologic exam status post LAVH with BSO.  Pap test done on the vaginal vault.  Breast exam normal.  Screening mammogram June 2022 was benign.  Colonoscopy scheduled for November 2022.  Good body mass index at 21.83.  Health labs with family physician. - Cytology - PAP( [place])  2. S/P laparoscopic assisted vaginal  hysterectomy (LAVH) with BSO  3. Severe vulvar dysplasia, histologically confirmed Colpo of vulva negative in 2019. - Cytology - PAP( [place])  4. Postmenopause Well on no hormone replacement therapy.  5. Osteopenia of multiple sites Bone density July 2022 was stable with osteopenia T score -1.7.  6. Malignant neoplasm of left breast, stage 1, estrogen receptor positive ([place]) Recent mammogram was benign in June 2022.  7. Recurrent major depressive disorder, in partial remission (HCC)  Stable on the Wellbutrin.  No contraindication to continue.  Prescription sent to pharmacy.  [name], 9:48 AM 06/13/2021

## 2021-06-20 LAB — CYTOLOGY - PAP
Comment: NEGATIVE
High risk HPV: NEGATIVE

## 2021-08-14 DIAGNOSIS — S93402A Sprain of unspecified ligament of left ankle, initial encounter: Secondary | ICD-10-CM | POA: Diagnosis not present

## 2021-08-16 DIAGNOSIS — Z86010 Personal history of colonic polyps: Secondary | ICD-10-CM | POA: Diagnosis not present

## 2021-08-16 DIAGNOSIS — K648 Other hemorrhoids: Secondary | ICD-10-CM | POA: Diagnosis not present

## 2021-08-16 DIAGNOSIS — D125 Benign neoplasm of sigmoid colon: Secondary | ICD-10-CM | POA: Diagnosis not present

## 2021-11-10 DIAGNOSIS — Z87448 Personal history of other diseases of urinary system: Secondary | ICD-10-CM | POA: Diagnosis not present

## 2021-11-10 DIAGNOSIS — Z1159 Encounter for screening for other viral diseases: Secondary | ICD-10-CM | POA: Diagnosis not present

## 2021-11-10 DIAGNOSIS — I700 Atherosclerosis of aorta: Secondary | ICD-10-CM | POA: Diagnosis not present

## 2021-11-10 DIAGNOSIS — I2510 Atherosclerotic heart disease of native coronary artery without angina pectoris: Secondary | ICD-10-CM | POA: Diagnosis not present

## 2021-11-10 DIAGNOSIS — Z23 Encounter for immunization: Secondary | ICD-10-CM | POA: Diagnosis not present

## 2021-11-10 DIAGNOSIS — E785 Hyperlipidemia, unspecified: Secondary | ICD-10-CM | POA: Diagnosis not present

## 2021-11-10 DIAGNOSIS — Z0000 Encounter for general adult medical examination without abnormal findings: Secondary | ICD-10-CM | POA: Diagnosis not present

## 2021-11-14 ENCOUNTER — Other Ambulatory Visit: Payer: Self-pay | Admitting: Family Medicine

## 2021-11-14 DIAGNOSIS — Z87891 Personal history of nicotine dependence: Secondary | ICD-10-CM

## 2021-12-02 ENCOUNTER — Ambulatory Visit
Admission: RE | Admit: 2021-12-02 | Discharge: 2021-12-02 | Disposition: A | Discharge status: Home / Self Care | Payer: BC Managed Care – PPO | Source: Ambulatory Visit | Attending: Family Medicine | Admitting: Family Medicine

## 2021-12-02 ENCOUNTER — Other Ambulatory Visit: Payer: Self-pay

## 2021-12-02 DIAGNOSIS — Z87891 Personal history of nicotine dependence: Secondary | ICD-10-CM

## 2021-12-02 DIAGNOSIS — F17210 Nicotine dependence, cigarettes, uncomplicated: Secondary | ICD-10-CM | POA: Diagnosis not present

## 2022-03-28 ENCOUNTER — Encounter: Payer: Self-pay | Admitting: Obstetrics & Gynecology

## 2022-03-28 DIAGNOSIS — Z1231 Encounter for screening mammogram for malignant neoplasm of breast: Secondary | ICD-10-CM | POA: Diagnosis not present

## 2022-04-07 DIAGNOSIS — F419 Anxiety disorder, unspecified: Secondary | ICD-10-CM | POA: Diagnosis not present

## 2022-04-07 DIAGNOSIS — N390 Urinary tract infection, site not specified: Secondary | ICD-10-CM | POA: Diagnosis not present

## 2022-04-07 DIAGNOSIS — M545 Low back pain, unspecified: Secondary | ICD-10-CM | POA: Diagnosis not present

## 2022-06-15 ENCOUNTER — Encounter: Payer: Self-pay | Admitting: Obstetrics & Gynecology

## 2022-06-15 ENCOUNTER — Other Ambulatory Visit (HOSPITAL_COMMUNITY)
Admission: RE | Admit: 2022-06-15 | Discharge: 2022-06-15 | Disposition: A | Discharge status: Home / Self Care | Payer: BC Managed Care – PPO | Source: Ambulatory Visit | Attending: Obstetrics & Gynecology | Admitting: Obstetrics & Gynecology

## 2022-06-15 ENCOUNTER — Ambulatory Visit (INDEPENDENT_AMBULATORY_CARE_PROVIDER_SITE_OTHER): Payer: BC Managed Care – PPO | Admitting: Obstetrics & Gynecology

## 2022-06-15 VITALS — BP 110/78 | HR 78 | Ht 63.25 in | Wt 128.0 lb

## 2022-06-15 DIAGNOSIS — Z01419 Encounter for gynecological examination (general) (routine) without abnormal findings: Secondary | ICD-10-CM

## 2022-06-15 DIAGNOSIS — R87622 Low grade squamous intraepithelial lesion on cytologic smear of vagina (LGSIL): Secondary | ICD-10-CM

## 2022-06-15 DIAGNOSIS — Z780 Asymptomatic menopausal state: Secondary | ICD-10-CM | POA: Diagnosis not present

## 2022-06-15 DIAGNOSIS — M8589 Other specified disorders of bone density and structure, multiple sites: Secondary | ICD-10-CM

## 2022-06-15 DIAGNOSIS — C50912 Malignant neoplasm of unspecified site of left female breast: Secondary | ICD-10-CM

## 2022-06-15 DIAGNOSIS — Z1272 Encounter for screening for malignant neoplasm of vagina: Secondary | ICD-10-CM | POA: Insufficient documentation

## 2022-06-15 DIAGNOSIS — F3341 Major depressive disorder, recurrent, in partial remission: Secondary | ICD-10-CM

## 2022-06-15 DIAGNOSIS — Z90710 Acquired absence of both cervix and uterus: Secondary | ICD-10-CM

## 2022-06-15 MED ORDER — BUPROPION HCL ER (XL) 150 MG PO TB24: 150.0000 mg | ORAL_TABLET | Freq: Every day | ORAL | 4 refills | Status: DC

## 2022-06-15 NOTE — Progress Notes (Signed)
[name] 1961-04-24 [id number]   History:    61 y.o.  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   RP:  Established patient presenting for annual gyn exam    HPI: Status post LAVH/BSO in 2006.  Well on no hormone replacement therapy.  History of left breast cancer in 2004.  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  She had lumpectomy followed by radiation and then tamoxifen for 5 years.  Breasts normal now.  Recent screening mammogram 03/2022 was Benign.  History of VIN 3 post wide excision in 2011.  She had a recurrence of the VIN 3 in 2013 for which an excision was done showing negative margins.  Last Colpo of vulva 09/2018 Very mild AW, no Bx done. Pap Neg in 2021, but LGSIL in 06/2021.  Pap reflex today.  Patient is having no pelvic pain.  She is sexually active with her husband without difficulty or pain.  BMI 22.5. 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  Colono Benign Polyps 08/2021, repeat at 3 yrs.  BD 04/2021 Osteopenia T-Score -1.7.  Health labs with Fam MD.   Past medical history,surgical history, family history and social history were all reviewed and documented in the EPIC chart.  Gynecologic History No LMP recorded. Patient has had a hysterectomy.  Obstetric History OB History  Gravida Para Term Preterm AB Living  '1 1 1     1  '$ SAB IAB Ectopic Multiple Live Births          1    # Outcome Date GA Lbr Len/2nd Weight Sex Delivery Anes PTL Lv  1 Term     F Vag-Spont  N LIV     ROS: A ROS was performed and pertinent positives and negatives are included in the history. GENERAL: No fevers or chills. HEENT: No change in vision, no earache, sore throat or sinus congestion. NECK: No pain or stiffness. CARDIOVASCULAR: No chest pain or pressure. No palpitations. PULMONARY: No shortness of breath, cough or wheeze. GASTROINTESTINAL: No abdominal pain, nausea, vomiting or diarrhea, melena or bright red blood per rectum. GENITOURINARY: No urinary  frequency, urgency, hesitancy or dysuria. MUSCULOSKELETAL: No joint or muscle pain, no back pain, no recent trauma. DERMATOLOGIC: No rash, no itching, no lesions. ENDOCRINE: No polyuria, polydipsia, no heat or cold intolerance. No recent change in weight. HEMATOLOGICAL: No anemia or easy bruising or bleeding. NEUROLOGIC: No headache, seizures, numbness, tingling or weakness. PSYCHIATRIC: No depression, no loss of interest in normal activity or change in sleep pattern.     Exam:   BP 110/78   Pulse 78   Ht 5' 3.25" (1.607 m)   Wt 128 lb (58.1 kg)   SpO2 98%   BMI 22.50 kg/m   Body mass index is 22.5 kg/m.  General appearance : Well developed well nourished female. No acute distress HEENT: Eyes: no retinal hemorrhage or exudates,  Neck supple, trachea midline, no carotid bruits, no thyroidmegaly Lungs: Clear to auscultation, no rhonchi or wheezes, or rib retractions  Heart: Regular rate and rhythm, no murmurs or gallops Breast:Examined in sitting and supine position were symmetrical in appearance, no palpable masses or tenderness,  no skin retraction, no nipple inversion, no nipple discharge, no skin discoloration, no axillary or supraclavicular lymphadenopathy Abdomen: no palpable masses or tenderness, no rebound or guarding Extremities: no edema or skin discoloration or tenderness  Pelvic: Vulva: Normal  Vagina: No gross lesions or discharge.  Pap reflex done.  Cervix/Uterus absent  Adnexa  Without masses or tenderness  Anus: Normal   Assessment/Plan:  61 y.o. female for annual exam   1. Encounter for Papanicolaou smear of vagina as part of routine gynecological examination Status post LAVH/BSO in 2006.  Well on no hormone replacement therapy.  History of left breast cancer in 2004.  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  She had lumpectomy followed by radiation and then tamoxifen for 5 years.  Breasts normal now.  Recent screening mammogram 03/2022 was  Benign.  History of VIN 3 post wide excision in 2011.  She had a recurrence of the VIN 3 in 2013 for which an excision was done showing negative margins.  Last Colpo of vulva 09/2018 Very mild AW, no Bx done. Pap Neg in 2021, but LGSIL in 06/2021.  Pap reflex today.  Patient is having no pelvic pain.  She is sexually active with her husband without difficulty or pain.  BMI 22.5. 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  Colono Benign Polyps 08/2021, repeat at 3 yrs.  BD 04/2021 Osteopenia T-Score -1.7.  Health labs with Fam MD. - Cytology - PAP( [place])  2. LGSIL Pap smear of vagina - Cytology - PAP( [place])  3. S/P laparoscopic assisted vaginal hysterectomy (LAVH) with BSO  4. Postmenopause tatus post LAVH/BSO in 2006.  Well on no hormone replacement therapy.   5. Osteopenia of multiple sites BD 04/2021 Osteopenia T-Score -1.7. Vit D, Ca++ total 1.5 g/d, weight bearing activities.  6. Recurrent major depressive disorder, in partial remission (HCC) Stable on Wellbutrin, prescription sent to pharmacy.  7. Malignant neoplasm of left breast, stage 1, estrogen receptor positive (HCC)  Other orders - rosuvastatin (CRESTOR) 10 MG tablet; Take 10 mg by mouth at bedtime. - VITAMIN D PO; Take by mouth. - FIBER PO; Take by mouth. - buPROPion (WELLBUTRIN XL) 150 MG 24 hr tablet; Take 1 tablet (150 mg total) by mouth daily.   [name], 1:39 PM 06/15/2022

## 2022-06-26 LAB — CYTOLOGY - PAP
Diagnosis: NEGATIVE
Diagnosis: REACTIVE

## 2022-10-31 DIAGNOSIS — B370 Candidal stomatitis: Secondary | ICD-10-CM | POA: Diagnosis not present

## 2022-10-31 DIAGNOSIS — F419 Anxiety disorder, unspecified: Secondary | ICD-10-CM | POA: Diagnosis not present

## 2022-11-14 DIAGNOSIS — I2510 Atherosclerotic heart disease of native coronary artery without angina pectoris: Secondary | ICD-10-CM | POA: Diagnosis not present

## 2022-11-14 DIAGNOSIS — E785 Hyperlipidemia, unspecified: Secondary | ICD-10-CM | POA: Diagnosis not present

## 2022-11-14 DIAGNOSIS — R350 Frequency of micturition: Secondary | ICD-10-CM | POA: Diagnosis not present

## 2022-11-14 DIAGNOSIS — I700 Atherosclerosis of aorta: Secondary | ICD-10-CM | POA: Diagnosis not present

## 2022-11-14 DIAGNOSIS — N289 Disorder of kidney and ureter, unspecified: Secondary | ICD-10-CM | POA: Diagnosis not present

## 2022-12-04 ENCOUNTER — Other Ambulatory Visit: Payer: Self-pay | Admitting: Family Medicine

## 2022-12-04 DIAGNOSIS — Z87891 Personal history of nicotine dependence: Secondary | ICD-10-CM

## 2023-03-30 DIAGNOSIS — Z1231 Encounter for screening mammogram for malignant neoplasm of breast: Secondary | ICD-10-CM | POA: Diagnosis not present

## 2023-04-02 ENCOUNTER — Encounter: Payer: Self-pay | Admitting: Obstetrics & Gynecology

## 2023-05-17 DIAGNOSIS — I700 Atherosclerosis of aorta: Secondary | ICD-10-CM | POA: Diagnosis not present

## 2023-05-17 DIAGNOSIS — J439 Emphysema, unspecified: Secondary | ICD-10-CM | POA: Diagnosis not present

## 2023-05-17 DIAGNOSIS — Z79899 Other long term (current) drug therapy: Secondary | ICD-10-CM | POA: Diagnosis not present

## 2023-05-17 DIAGNOSIS — I2510 Atherosclerotic heart disease of native coronary artery without angina pectoris: Secondary | ICD-10-CM | POA: Diagnosis not present

## 2023-05-17 DIAGNOSIS — E785 Hyperlipidemia, unspecified: Secondary | ICD-10-CM | POA: Diagnosis not present

## 2023-07-04 DIAGNOSIS — R059 Cough, unspecified: Secondary | ICD-10-CM | POA: Diagnosis not present

## 2023-07-04 DIAGNOSIS — Z20822 Contact with and (suspected) exposure to covid-19: Secondary | ICD-10-CM | POA: Diagnosis not present

## 2023-07-04 DIAGNOSIS — J209 Acute bronchitis, unspecified: Secondary | ICD-10-CM | POA: Diagnosis not present

## 2023-07-17 ENCOUNTER — Other Ambulatory Visit (HOSPITAL_COMMUNITY)
Admission: RE | Admit: 2023-07-17 | Discharge: 2023-07-17 | Disposition: A | Discharge status: Home / Self Care | Payer: BC Managed Care – PPO | Source: Ambulatory Visit | Attending: Obstetrics and Gynecology | Admitting: Obstetrics and Gynecology

## 2023-07-17 ENCOUNTER — Ambulatory Visit (INDEPENDENT_AMBULATORY_CARE_PROVIDER_SITE_OTHER): Payer: BC Managed Care – PPO | Admitting: Obstetrics and Gynecology

## 2023-07-17 ENCOUNTER — Encounter: Payer: Self-pay | Admitting: Obstetrics and Gynecology

## 2023-07-17 VITALS — BP 110/72 | HR 83 | Ht 63.75 in | Wt 132.0 lb

## 2023-07-17 DIAGNOSIS — N393 Stress incontinence (female) (male): Secondary | ICD-10-CM | POA: Diagnosis not present

## 2023-07-17 DIAGNOSIS — Z01419 Encounter for gynecological examination (general) (routine) without abnormal findings: Secondary | ICD-10-CM | POA: Insufficient documentation

## 2023-07-17 DIAGNOSIS — D229 Melanocytic nevi, unspecified: Secondary | ICD-10-CM

## 2023-07-17 DIAGNOSIS — M8580 Other specified disorders of bone density and structure, unspecified site: Secondary | ICD-10-CM | POA: Diagnosis not present

## 2023-07-17 DIAGNOSIS — D071 Carcinoma in situ of vulva: Secondary | ICD-10-CM

## 2023-07-17 MED ORDER — BUPROPION HCL ER (XL) 150 MG PO TB24: 150.0000 mg | ORAL_TABLET | Freq: Every day | ORAL | 4 refills | Status: DC

## 2023-07-17 MED ORDER — ESTRADIOL 0.1 MG/GM VA CREA: 1.0000 g | TOPICAL_CREAM | Freq: Every day | VAGINAL | 12 refills | Status: AC

## 2023-07-17 NOTE — Progress Notes (Signed)
62 y.o. y.o. female here for annual exam.   History:    62 y.o.  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  Daughter is 19 yo. Who lives on property with her and step daughter.   RP:  Established patient presenting for annual gyn exam    HPI: Status post LAVH/BSO in 2006 with obstruction and then laparotomy.  Well on no hormone replacement therapy.  History of left breast cancer in 2004.  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  She had lumpectomy followed by radiation and then tamoxifen for 5 years.  Breasts normal now.  Recent screening mammogram 03/2022 was Benign.  History of VIN 3 post wide excision in 2011.  She had a recurrence of the VIN 3 in 2013 for which an excision was done showing negative margins.  Last Colpo of vulva 09/2018 Very mild AW, no Bx done. Pap Neg in 2021, but LGSIL in 06/2021.  Pap reflex today.  Patient is having no pelvic pain.  She is sexually active with her husband without difficulty or pain.  BMI 22.5. 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  Colono Benign Polyps 08/2021, repeat at 1 yrs.  BD 04/2021 Osteopenia T-Score -1.7.  Health labs with Fam MD.  Reports bothersome SUI with laughing, coughing sneezing Denies any vulvar pruritis  Blood pressure 110/72, pulse 83, height 5' 3.75" (1.619 m), weight 132 lb (59.9 kg), SpO2 99%.     Component Value Date/Time   DIAGPAP  06/15/2022 1353    - Negative for Intraepithelial Lesions or Malignancy (NILM)   DIAGPAP - Benign reactive/reparative changes 06/15/2022 1353   DIAGPAP - Low grade squamous intraepithelial lesion (LSIL) (A) 06/13/2021 1017   HPVHIGH Negative 06/13/2021 1017   ADEQPAP Satisfactory for evaluation. 06/15/2022 1353   ADEQPAP Satisfactory for evaluation. 06/13/2021 1017    GYN HISTORY:    Component Value Date/Time   DIAGPAP  06/15/2022 1353    - Negative for Intraepithelial Lesions or Malignancy (NILM)   DIAGPAP - Benign reactive/reparative changes 06/15/2022 1353   DIAGPAP - Low  grade squamous intraepithelial lesion (LSIL) (A) 06/13/2021 1017   HPVHIGH Negative 06/13/2021 1017   ADEQPAP Satisfactory for evaluation. 06/15/2022 1353   ADEQPAP Satisfactory for evaluation. 06/13/2021 1017    OB History  Gravida Para Term Preterm AB Living  1 1 1     1   SAB IAB Ectopic Multiple Live Births          1    # Outcome Date GA Lbr Len/2nd Weight Sex Type Anes PTL Lv  1 Term     F Vag-Spont  N LIV    Past Medical History:  Diagnosis Date   Depression    Elevated cholesterol    History of breast cancer 2003 S/P LUMPECTOMY AND RADIATION --- no recurrence   Hyperplastic colon polyp 11/02/2010   S/P radiation therapy 09/2002---11/2002   RADIATION OF LEFT  BREAST   Vulvar intraepithelial neoplasia III     Past Surgical History:  Procedure Laterality Date   ABDOMINAL SURGERY     LAPAROTOMY, SBO,LYSIS OF ADHESIONS   BREAST SURGERY  08-05-2002  DR Rometta Emery   LEFT BREAST LUMPECTOMY W/ NODE BX  ---STAGE 1 TUBULAR CA    CERVICAL BIOPSY  W/ LOOP ELECTRODE EXCISION     LEEP CONE   GYNECOLOGIC CRYOSURGERY     LAPAROSCOPIC ASSISTED VAGINAL HYSTERECTOMY  03-02-2005  DR Lily Peer   W/ BILATERAL SALPINGO-OOPHORECTOMY   LAPAROTOMY /  LYSIS ADHESIONS FOR SMALL BOWEL OBSTRUCTION  03-15-2005  DR HOXWORTH   VIN II     right labia majora  (verruca with moderate dysplasia)   VIN III     Forchette/Wide local excision   VULVECTOMY  08/23/2012   Procedure: WIDE EXCISION VULVECTOMY;  Surgeon: Ok Edwards, MD;  Location: [facility];  Service: Gynecology;  Laterality: N/A;  WIDE LOCAL EXCISION OF LABIA MAJORA  REQUESTS ONE HOUR OR TIME   WIDE LOCAL EXCISION FORCHETTE  04/22/2000   DR. FERNANDEZ-SURGEON--VULVAR CARCINOMA INSITU    Current Outpatient Medications on File Prior to Visit  Medication Sig Dispense Refill   buPROPion (WELLBUTRIN XL) 150 MG 24 hr tablet Take 1 tablet (150 mg total) by mouth daily. 90 tablet 4   Calcium Carbonate-Vitamin D (CALCIUM 600  + D PO) Take 2 tablets by mouth daily.     FIBER PO Take by mouth.     rosuvastatin (CRESTOR) 20 MG tablet Take 40 mg by mouth at bedtime.     VITAMIN D PO Take by mouth.     No current facility-administered medications on file prior to visit.    Social History   Socioeconomic History   Marital status: Married    Spouse name: Not on file   Number of children: Not on file   Years of education: Not on file   Highest education level: Not on file  Occupational History   Not on file  Tobacco Use   Smoking status: Every Day    Current packs/day: 0.50    Average packs/day: 0.5 packs/day for 22.0 years (11.0 ttl pk-yrs)    Types: Cigarettes   Smokeless tobacco: Never  Vaping Use   Vaping status: Never Used  Substance and Sexual Activity   Alcohol use: No    Alcohol/week: 0.0 standard drinks of alcohol   Drug use: No   Sexual activity: Yes    Birth control/protection: Surgical, Other-see comments    Comment: hysterectomy, HUSBAND HAD VASECTOMY  Other Topics Concern   Not on file  Social History Narrative   Not on file   Social Determinants of Health   Financial Resource Strain: Not on file  Food Insecurity: Not on file  Transportation Needs: Not on file  Physical Activity: Not on file  Stress: Not on file  Social Connections: Not on file  Intimate Partner Violence: Not on file    History reviewed. No pertinent family history.   Allergies  Allergen Reactions   Aleve [Naproxen] Hives      Patient's last menstrual period was No LMP recorded. Patient has had a hysterectomy..            Review of Systems Alls systems reviewed and are negative.     Physical Exam Constitutional:      Appearance: Normal appearance.  Genitourinary:     Vulva normal.     No lesions in the vagina.     Right Labia: No rash, lesions or skin changes.    Left Labia: No lesions, skin changes or rash.       Vaginal cuff intact.    No vaginal discharge or tenderness.     No vaginal  prolapse present.    No vaginal atrophy present.     Right Adnexa: not absent.    Left Adnexa: not absent.    Cervix is not absent.     Uterus is not absent. Breasts:    Right: Normal.     Left: Normal.  HENT:     Head: Normocephalic.  Neck:     Thyroid: No thyroid mass, thyromegaly or thyroid tenderness.  Cardiovascular:     Rate and Rhythm: Normal rate and regular rhythm.     Heart sounds: Normal heart sounds, S1 normal and S2 normal.  Pulmonary:     Effort: Pulmonary effort is normal.     Breath sounds: Normal breath sounds and air entry.  Abdominal:     General: Bowel sounds are normal. There is no distension.     Palpations: Abdomen is soft. There is no mass.     Tenderness: There is no abdominal tenderness. There is no guarding or rebound.  Musculoskeletal:     Cervical back: Full passive range of motion without pain, normal range of motion and neck supple. No tenderness.     Right lower leg: No edema.     Left lower leg: No edema.  Neurological:     Mental Status: She is alert.  Skin:    General: Skin is warm.  Psychiatric:        Mood and Affect: Mood normal.        Behavior: Behavior normal.        Thought Content: Thought content normal.  Vitals and nursing note reviewed. Exam conducted with a chaperone present.       A:         Well Woman GYN exam, h/o VIN3, breast cancer, osteopenia, h/o LGSIL                             P:        Pap smear collected today Encouraged annual mammogram screening  DXA ordered today Labs and immunizations to do with PMD Discussed breast self exams Encouraged healthy lifestyle practices Encouraged Vit D and Calcium  Referral to urogyn: patient desires bladder sling for SUI.  To begin estrace cream Atypical moles: referral to dermatology Hymenal hypopigmentation: return for colposcopy and punch biopsy No follow-ups on file.  Earley Favor

## 2023-07-19 LAB — CYTOLOGY - PAP
Adequacy: ABSENT
Comment: NEGATIVE
Diagnosis: NEGATIVE
High risk HPV: NEGATIVE

## 2023-08-14 ENCOUNTER — Encounter: Payer: Self-pay | Admitting: Obstetrics and Gynecology

## 2023-08-14 ENCOUNTER — Ambulatory Visit (INDEPENDENT_AMBULATORY_CARE_PROVIDER_SITE_OTHER): Payer: BC Managed Care – PPO | Admitting: Obstetrics and Gynecology

## 2023-08-14 ENCOUNTER — Other Ambulatory Visit (HOSPITAL_COMMUNITY)
Admission: RE | Admit: 2023-08-14 | Discharge: 2023-08-14 | Disposition: A | Discharge status: Home / Self Care | Payer: BC Managed Care – PPO | Source: Ambulatory Visit | Attending: Obstetrics and Gynecology | Admitting: Obstetrics and Gynecology

## 2023-08-14 VITALS — BP 110/80 | HR 77

## 2023-08-14 DIAGNOSIS — D071 Carcinoma in situ of vulva: Secondary | ICD-10-CM

## 2023-08-14 DIAGNOSIS — N900 Mild vulvar dysplasia: Secondary | ICD-10-CM | POA: Diagnosis not present

## 2023-08-14 NOTE — Progress Notes (Signed)
[name] [birth year] [id number]   History:    62 y.o.  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   RP:  Established patient presenting for vular colposcopy for h/o VIN3 with excision.  Denies any pruritus today   HPI: Status post LAVH/BSO in 2006.  Well on no hormone replacement therapy.  History of left breast cancer in 2004.  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  She had lumpectomy followed by radiation and then tamoxifen for 5 years.  Breasts normal now.  Recent screening mammogram 03/2022 was Benign.  History of VIN 3 post wide excision in 2011.  She had a recurrence of the VIN 3 in 2013 for which an excision was done showing negative margins.  Last Colpo of vulva 09/2018 Very mild AW, no Bx done. Pap Neg in 2021, but LGSIL in 06/2021.  Pap reflex today.  Patient is having no pelvic pain.  She is sexually active with her husband without difficulty or pain.  BMI 22.5. 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  Colono Benign Polyps 08/2021, repeat at 3 yrs.  BD 04/2021 Osteopenia T-Score -1.7.  Health labs with Fam MD.   Past medical history,surgical history, family history and social history were all reviewed and documented in the EPIC chart.  Gynecologic History No LMP recorded. Patient has had a hysterectomy.  Obstetric History OB History  Gravida Para Term Preterm AB Living  1 1 1     1   SAB IAB Ectopic Multiple Live Births          1    # Outcome Date GA Lbr Len/2nd Weight Sex Type Anes PTL Lv  1 Term     F Vag-Spont  N LIV    Colposcopy Procedure Note [name] 08/14/2023  Indications:  Procedure Details  The risks and benefits of the procedure and Written informed consent obtained.  acetic acid solution apply to vulva and introitus Hypopigmentation vs old scarring at introitus  Patient consents for punch biopsy of the area  Procedure: Punch biopsy Patient consented for the procedure with written consent.  The area was  cleaned with betadine.  1 cc of lidocaine with epi was injected subcutaneously.  A 3 mm punch biopsy was used and the biopsy was then lifted and cut with the scissors and sent to pathology.  Hemostasis was achieved with silver nitrate.  Patient tolerated the procedure well.  Post care instructions were discussed with patient.      Hemostasis obtained with application of Silver Nitrate     Complications:    Patient tolerated the procedure well  Plan:  To notify patient in epic and phone call of results and plan of care Post care instructions discussed. RTC with any concerns   Earley Favor   Exam:   There were no vitals taken for this visit.  There is no height or weight on file to calculate BMI.  General appearance : Well developed well nourished female. No acute distress HEENT: Eyes: no retinal hemorrhage or exudates,  Neck supple, trachea midline, no carotid bruits, no thyroidmegaly Lungs: Clear to auscultation, no rhonchi or wheezes, or rib retractions  Heart: Regular rate and rhythm, no murmurs or gallops Breast:Examined in sitting and supine position were symmetrical in appearance, no palpable masses or tenderness,  no skin retraction, no nipple inversion, no nipple discharge, no skin discoloration, no axillary or supraclavicular lymphadenopathy Abdomen: no palpable masses  or tenderness, no rebound or guarding Extremities: no edema or skin discoloration or tenderness  Pelvic: Vulva: Normal             Vagina: No gross lesions or discharge.  Pap reflex done.  Cervix/Uterus absent  Adnexa  Without masses or tenderness  Anus: Normal   Assessment/Plan:  62 y.o. female for annual exam   1. Encounter for Papanicolaou smear of vagina as part of routine gynecological examination Status post LAVH/BSO in 2006.  Well on no hormone replacement therapy.  History of left breast cancer in 2004.  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  She had lumpectomy followed by  radiation and then tamoxifen for 5 years.  Breasts normal now.  Recent screening mammogram 03/2022 was Benign.  History of VIN 3 post wide excision in 2011.  She had a recurrence of the VIN 3 in 2013 for which an excision was done showing negative margins.  Last Colpo of vulva 09/2018 Very mild AW, no Bx done. Pap Neg in 2021, but LGSIL in 06/2021.  Pap reflex today.  Patient is having no pelvic pain.  She is sexually active with her husband without difficulty or pain.  BMI 22.5. 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  Colono Benign Polyps 08/2021, repeat at 3 yrs.  BD 04/2021 Osteopenia T-Score -1.7.  Health labs with Fam MD. - Cytology - PAP( [place]) normal 07/17/23   Earley Favor MD, 11:46 AM 08/14/2023

## 2023-08-16 ENCOUNTER — Other Ambulatory Visit: Payer: Self-pay

## 2023-08-16 DIAGNOSIS — N900 Mild vulvar dysplasia: Secondary | ICD-10-CM

## 2023-08-16 DIAGNOSIS — D071 Carcinoma in situ of vulva: Secondary | ICD-10-CM

## 2023-08-16 LAB — SURGICAL PATHOLOGY

## 2023-08-22 ENCOUNTER — Other Ambulatory Visit: Payer: Self-pay | Admitting: Obstetrics

## 2023-08-22 ENCOUNTER — Telehealth: Payer: Self-pay | Admitting: *Deleted

## 2023-08-22 ENCOUNTER — Encounter: Payer: Self-pay | Admitting: Obstetrics

## 2023-08-22 ENCOUNTER — Ambulatory Visit (INDEPENDENT_AMBULATORY_CARE_PROVIDER_SITE_OTHER): Payer: BC Managed Care – PPO | Admitting: Obstetrics

## 2023-08-22 ENCOUNTER — Other Ambulatory Visit (HOSPITAL_COMMUNITY)
Admission: RE | Admit: 2023-08-22 | Discharge: 2023-08-22 | Disposition: A | Discharge status: Home / Self Care | Payer: BC Managed Care – PPO | Source: Other Acute Inpatient Hospital | Attending: Obstetrics | Admitting: Obstetrics

## 2023-08-22 VITALS — BP 106/69 | HR 84 | Ht 63.0 in | Wt 129.8 lb

## 2023-08-22 DIAGNOSIS — R350 Frequency of micturition: Secondary | ICD-10-CM

## 2023-08-22 DIAGNOSIS — Z720 Tobacco use: Secondary | ICD-10-CM | POA: Insufficient documentation

## 2023-08-22 DIAGNOSIS — N393 Stress incontinence (female) (male): Secondary | ICD-10-CM | POA: Insufficient documentation

## 2023-08-22 LAB — URINALYSIS, COMPLETE (UACMP) WITH MICROSCOPIC
Bacteria, UA: NONE SEEN
Bilirubin Urine: NEGATIVE
Glucose, UA: NEGATIVE mg/dL
Ketones, ur: NEGATIVE mg/dL
Leukocytes,Ua: NEGATIVE
Nitrite: NEGATIVE
Protein, ur: NEGATIVE mg/dL
Specific Gravity, Urine: 1.009 (ref 1.005–1.030)
pH: 6 (ref 5.0–8.0)

## 2023-08-22 LAB — POCT URINALYSIS DIPSTICK
Bilirubin, UA: NEGATIVE
Blood, UA: POSITIVE
Glucose, UA: NEGATIVE
Ketones, UA: NEGATIVE
Leukocytes, UA: NEGATIVE
Nitrite, UA: NEGATIVE
Protein, UA: NEGATIVE
Spec Grav, UA: 1.01 (ref 1.010–1.025)
Urobilinogen, UA: 0.2 U/dL — AB
pH, UA: 6 (ref 5.0–8.0)

## 2023-08-22 MED ORDER — GEMTESA 75 MG PO TABS: 75.0000 mg | ORAL_TABLET | Freq: Every day | ORAL | Status: DC

## 2023-08-22 MED ORDER — GEMTESA 75 MG PO TABS: 75.0000 mg | ORAL_TABLET | Freq: Every day | ORAL | 2 refills | Status: DC

## 2023-08-22 NOTE — Assessment & Plan Note (Addendum)
-   POCT + heme, pending UA micro and culture - We discussed the symptoms of overactive bladder (OAB), which include urinary urgency, urinary frequency, nocturia, with or without urge incontinence.  While we do not know the exact etiology of OAB, several treatment options exist. We discussed management including behavioral therapy (decreasing bladder irritants, urge suppression strategies, timed voids, bladder retraining), physical therapy, medication; for refractory cases posterior tibial nerve stimulation, sacral neuromodulation, and intravesical botulinum toxin injection.  For anticholinergic medications, we discussed the potential side effects of anticholinergics including dry eyes, dry mouth, constipation, cognitive impairment and urinary retention. For Beta-3 agonist medication, we discussed the potential side effect of elevated blood pressure which is more likely to occur in individuals with uncontrolled hypertension. - encouraged to reduce caffeine intake - sample and Rx Gemtesa if refractory to bladder training, caffeine reduction and Kegels - continue fiber supplementation, encouraged to titrate until BM 3x/week and assess impact on urinary symptoms.

## 2023-08-22 NOTE — Progress Notes (Signed)
New Patient Evaluation and Consultation  Referring Provider: Earley Favor, MD PCP: Ailene Ravel, MD Date of Service: 08/22/2023  SUBJECTIVE Chief Complaint: New Patient (Initial Visit) [place] GOVEA is a 62 y.o. female here for urinary incontinence. )  History of Present Illness: RICKELL CHAMPLIN is a 62 y.o. White or Caucasian female seen in consultation at the request of Dr Karma Greaser for evaluation of stress urinary incontinence.    Urinary leakage start until 1 year ago. S/p LAVH/BSO in 03/02/05 by Dr. 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  Laparotomy and LOA for SBO on 03/15/05 by Dr. Johna Sheriff  VIN III s/p wide excision in 2001, repeated excision in 2013 by Dr. Lily Peer with negative margins. Vulvar biopsy 08/14/23 with VIN I, NILM pap 07/17/23 neg HPV  Urinary Symptoms: Leaks urine with cough/ sneeze, laughing, and with urgency Leaks 2-3 time(s) per days with laughing/coughing.  Leaks 1x/month with urgency Denies pad use or need to change clothing Patient is bothered by UI symptoms.  Day time voids 10.  Nocturia: 0-1 times per night to void. Voiding dysfunction:  empties bladder well.  Patient does not use a catheter to empty bladder.  When urinating, patient feels a weak stream and dribbling after finishing Drinks: <64 oz water per day, 1 cup of coffee, 24oz Mountain Dew sodas, 8oz decaf tea  UTIs:  0  UTI's in the last year.   Denies history of blood in urine, kidney or bladder stones, pyelonephritis, bladder cancer, and kidney cancer No results found for the last 90 days.   Pelvic Organ Prolapse Symptoms:                  Patient Denies a feeling of a bulge the vaginal area.   Bowel Symptom: Bowel movements: every 3 days since SBO surgery Stool consistency: soft  Straining: no.  Splinting: no.  Incomplete evacuation: no.  Patient Denies accidental  bowel leakage / fecal incontinence Bowel regimen: fiber Last colonoscopy: 08/2021 or 2023 with polyps per pt [facility]          Overdue - Colonoscopy (Every 10 Years) Overdue since 04/02/2021    04/03/2011  [facility]ONOS[facility]   Only the first 1 history entries have been loaded, but more history exists.           Sexual Function Sexually active: yes.  Sexual orientation: Straight Pain with sex: No  Pelvic Pain Denies pelvic pain  Past Medical History:  Past Medical History:  Diagnosis Date   Depression    Elevated cholesterol    History of breast cancer 2003 S/P LUMPECTOMY AND RADIATION --- no recurrence   Hyperplastic colon polyp 11/02/2010   S/P radiation therapy 09/2002---11/2002   RADIATION OF LEFT  BREAST   Vulvar intraepithelial neoplasia III      Past Surgical History:   Past Surgical History:  Procedure Laterality Date   ABDOMINAL SURGERY     LAPAROTOMY, SBO,LYSIS OF ADHESIONS   BREAST SURGERY  08-05-2002  DR Rometta Emery   LEFT BREAST LUMPECTOMY W/ NODE BX  ---STAGE 1 TUBULAR CA    CERVICAL BIOPSY  W/ LOOP ELECTRODE EXCISION     LEEP CONE   GYNECOLOGIC CRYOSURGERY     LAPAROSCOPIC ASSISTED VAGINAL HYSTERECTOMY  03-02-2005  DR FERNANDEZ   W/ BILATERAL SALPINGO-OOPHORECTOMY   LAPAROTOMY / LYSIS ADHESIONS FOR SMALL BOWEL OBSTRUCTION  03-15-2005  DR HOXWORTH   VIN II  right labia majora  (verruca with moderate dysplasia)   VIN III     Forchette/Wide local excision   VULVECTOMY  08/23/2012   Procedure: WIDE EXCISION VULVECTOMY;  Surgeon: Ok Edwards, MD;  Location: [facility];  Service: Gynecology;  Laterality: N/A;  WIDE LOCAL EXCISION OF LABIA MAJORA  REQUESTS ONE HOUR OR TIME   WIDE LOCAL EXCISION FORCHETTE  04/22/2000   DR. FERNANDEZ-SURGEON--VULVAR CARCINOMA INSITU     Past OB/GYN History: OB History  Gravida Para Term Preterm AB Living  1 1 1     1   SAB IAB Ectopic Multiple Live Births          1    # Outcome Date GA  Lbr Len/2nd Weight Sex Type Anes PTL Lv  1 Term     F Vag-Spont  N LIV    Vaginal deliveries: 1, 6lbs Forceps/ Vacuum deliveries: 0, Cesarean section: 0 Menopausal: Yes, at age 52 with Tamoxifen and radiation, Denies vaginal bleeding since menopause Contraception: s/p hysterectomy. Last pap smear.  Any history of abnormal pap smears: yes.    Component Value Date/Time   DIAGPAP  07/17/2023 0839    - Negative for intraepithelial lesion or malignancy (NILM)   DIAGPAP  06/15/2022 1353    - Negative for Intraepithelial Lesions or Malignancy (NILM)   DIAGPAP - Benign reactive/reparative changes 06/15/2022 1353   HPVHIGH Negative 07/17/2023 0839   HPVHIGH Negative 06/13/2021 1017   ADEQPAP  07/17/2023 0839    Satisfactory for evaluation; transformation zone component ABSENT.   ADEQPAP Satisfactory for evaluation. 06/15/2022 1353   ADEQPAP Satisfactory for evaluation. 06/13/2021 1017    Medications: Patient has a current medication list which includes the following prescription(s): bupropion, calcium carb-cholecalciferol, estradiol, fiber, rosuvastatin, gemtesa, gemtesa, and vitamin d.   Allergies: Patient is allergic to aleve [naproxen].   Social History:  Social History   Tobacco Use   Smoking status: Every Day    Current packs/day: 0.50    Average packs/day: 0.5 packs/day for 22.0 years (11.0 ttl pk-yrs)    Types: Cigarettes   Smokeless tobacco: Never  Vaping Use   Vaping status: Never Used  Substance Use Topics   Alcohol use: No    Alcohol/week: 0.0 standard drinks of alcohol   Drug use: No    Relationship status: married Patient lives with her husband.   Patient is self-employed on her farm. Regular exercise: Yes: farm activities History of abuse: No  Family History:   Family History  Problem Relation Age of Onset   Dementia Mother    Hepatitis C Father    Liver disease Father      Review of Systems: Review of Systems  Constitutional:  Negative for fever,  malaise/fatigue and weight loss.  Respiratory:  Negative for cough, shortness of breath and wheezing.   Cardiovascular:  Negative for chest pain, palpitations and leg swelling.  Gastrointestinal:  Negative for abdominal pain and blood in stool.  Genitourinary:  Negative for hematuria.  Skin:  Negative for rash.  Neurological:  Negative for dizziness, weakness and headaches.  Endo/Heme/Allergies:  Does not bruise/bleed easily.  Psychiatric/Behavioral:  Negative for depression. The patient is not nervous/anxious.      OBJECTIVE Physical Exam: Vitals:   08/22/23 1346  BP: 106/69  Pulse: 84  Weight: 129 lb 12.8 oz (58.9 kg)  Height: 5\' 3"  (1.6 m)    Physical Exam Constitutional:      General: She is not in acute distress.    Appearance:  Normal appearance.  Genitourinary:     Bladder and urethral meatus normal.     No lesions in the vagina.     Genitourinary Comments: Biopsy site healing with granulation tissue, no bleeding or erythema     Right Labia: No rash, tenderness, lesions, skin changes or Bartholin's cyst.    Left Labia: No tenderness, lesions, skin changes, Bartholin's cyst or rash.       No vaginal discharge, erythema, tenderness, bleeding, ulceration or granulation tissue.     Anterior vaginal prolapse present.     Right Adnexa: not tender, not full and no mass present.    Left Adnexa: not tender, not full and no mass present.    Cervix is absent.     Uterus is absent.     Urethral meatus caruncle not present.    No urethral prolapse, tenderness, mass, hypermobility, discharge or stress urinary incontinence with cough stress test present.     Bladder is not tender, urgency on palpation not present and masses not present.      Pelvic Floor: Levator muscle strength is 4/5.    Levator ani not tender, obturator internus not tender, no asymmetrical contractions present and no pelvic spasms present.    Symmetrical pelvic sensation, anal wink present and BC reflex  present. Cardiovascular:     Rate and Rhythm: Normal rate.  Pulmonary:     Effort: Pulmonary effort is normal. No respiratory distress.  Abdominal:     General: There is no distension.     Palpations: There is no mass.     Tenderness: There is no abdominal tenderness.     Hernia: No hernia is present.  Neurological:     Mental Status: She is alert.  Vitals reviewed. Exam conducted with a chaperone present.     POP-Q:   POP-Q  -2                                            Aa   -2                                           Ba  -5                                              C   1                                            Gh  3                                            Pb  6                                            tvl   -3  Ap  -3                                            Bp                                                 D    Post-Void Residual (PVR) by Bladder Scan: In order to evaluate bladder emptying, we discussed obtaining a postvoid residual and patient agreed to this procedure.  Procedure: The ultrasound unit was placed on the patient's abdomen in the suprapubic region after the patient had voided.    Post Void Residual - 08/22/23 1354       Post Void Residual   Post Void Residual 0 mL            Laboratory Results: Lab Results  Component Value Date   COLORU yellow 08/22/2023   CLARITYU clear 08/22/2023   GLUCOSEUR Negative 08/22/2023   BILIRUBINUR Negative 08/22/2023   KETONESU Negative 08/22/2023   SPECGRAV 1.010 08/22/2023   RBCUR positive 08/22/2023   PHUR 6.0 08/22/2023   PROTEINUR Negative 08/22/2023   UROBILINOGEN 0.2 (A) 08/22/2023   LEUKOCYTESUR Negative 08/22/2023    Lab Results  Component Value Date   CREATININE 1.10 (H) 05/20/2020   CREATININE 1.08 (H) 03/07/2019   CREATININE 0.96 12/13/2017    No results found for: "HGBA1C"  Lab Results  Component Value Date   HGB 13.3  05/20/2020     ASSESSMENT AND PLAN Ms. Zammit is a 62 y.o. with:  1. SUI (stress urinary incontinence, female)   2. Urinary frequency   3. Tobacco use     SUI (stress urinary incontinence, female) Assessment & Plan: For treatment of stress urinary incontinence,  non-surgical options include expectant management, weight loss, physical therapy, as well as a pessary.  Surgical options include a midurethral sling, Burch urethropexy, and transurethral injection of a bulking agent.    Urinary frequency Assessment & Plan: - POCT + heme, pending UA micro and culture - We discussed the symptoms of overactive bladder (OAB), which include urinary urgency, urinary frequency, nocturia, with or without urge incontinence.  While we do not know the exact etiology of OAB, several treatment options exist. We discussed management including behavioral therapy (decreasing bladder irritants, urge suppression strategies, timed voids, bladder retraining), physical therapy, medication; for refractory cases posterior tibial nerve stimulation, sacral neuromodulation, and intravesical botulinum toxin injection.  For anticholinergic medications, we discussed the potential side effects of anticholinergics including dry eyes, dry mouth, constipation, cognitive impairment and urinary retention. For Beta-3 agonist medication, we discussed the potential side effect of elevated blood pressure which is more likely to occur in individuals with uncontrolled hypertension. - encouraged to reduce caffeine intake - sample and Rx Gemtesa if refractory to bladder training, caffeine reduction and Kegels - continue fiber supplementation, encouraged to titrate until BM 3x/week and assess impact on urinary symptoms.  Orders: -     POCT urinalysis dipstick -     Gemtesa; Take 1 tablet (75 mg total) by mouth daily.  Dispense: 28 tablet -     Gemtesa; Take 1 tablet (75 mg total) by mouth daily.  Dispense: 30 tablet; Refill: 2 -      Urine Microscopic -  Urine Culture  Tobacco use Assessment & Plan: - encouraged to continue due to recurrent VIN pending referral to gyn onc - pending UA micro to r/o heme, low threshold for microscopic hematuria workup due to tobacco use  Orders: -     Urine Microscopic -     Urine Culture  Time spent: I spent 48 minutes dedicated to the care of this patient on the date of this encounter to include pre-visit review of records, face-to-face time with the patient discussing stress urinary incontinence, urinary frequency, tobacco use, and post visit documentation and ordering medication/ testing.   Loleta Chance, MD

## 2023-08-22 NOTE — Assessment & Plan Note (Signed)
For treatment of stress urinary incontinence,  non-surgical options include expectant management, weight loss, physical therapy, as well as a pessary.  Surgical options include a midurethral sling, Burch urethropexy, and transurethral injection of a bulking agent.

## 2023-08-22 NOTE — Telephone Encounter (Signed)
Spoke with the patient regarding the referral to GYN oncology. Patient scheduled as new patient with Dr Pricilla Holm on 12/13 at 9:45 am. Patient given an arrival time of 9:15 am.  Explained to the patient the the doctor will perform a pelvic exam at this visit. Patient given the policy that only one visitor allowed and that visitor must be over 16 yrs are allowed in the Cancer Center. Patient given the address/phone number for the clinic and that the center offers free valet service. Patient aware that masks are option.

## 2023-08-22 NOTE — Assessment & Plan Note (Addendum)
-   encouraged to continue due to recurrent VIN pending referral to gyn onc - pending UA micro to r/o heme, low threshold for microscopic hematuria workup due to tobacco use

## 2023-08-22 NOTE — Patient Instructions (Addendum)
For treatment of stress urinary incontinence, which is leakage with physical activity/movement/strainging/coughing, we discussed expectant management versus nonsurgical options versus surgery. Nonsurgical options include weight loss, physical therapy, as well as a pessary.  Surgical options include a midurethral sling, which is a synthetic mesh sling that acts like a hammock under the urethra to prevent leakage of urine, a Burch urethropexy, and transurethral injection of a bulking agent.   We discussed the symptoms of overactive bladder (OAB), which include urinary urgency, urinary frequency, night-time urination, with or without urge incontinence.  We discussed management including behavioral therapy (decreasing bladder irritants by following a bladder diet, urge suppression strategies, timed voids, bladder retraining), physical therapy, medication; and for refractory cases posterior tibial nerve stimulation, sacral neuromodulation, and intravesical botulinum toxin injection.   For Beta-3 agonist medication, we discussed the potential side effect of elevated blood pressure which is more likely to occur in individuals with uncontrolled hypertension. You were given samples fo Gemtesa 75 mg, start if you do not have relief after fluid management, Kegel exercises, and caffeine reduction.   It can take a month to start working so give it time, but if you have bothersome side effects call sooner and we can try a different medication.  Call us if you have trouble filling the prescription or if it's not covered by your insurance.  Continue your effects to stop tobacco use.

## 2023-08-22 NOTE — Addendum Note (Signed)
Addended by: Alexis Frock on: 08/22/2023 03:53 PM   Modules accepted: Orders

## 2023-08-24 LAB — URINE CULTURE: Culture: 40000 — AB

## 2023-09-12 ENCOUNTER — Encounter: Payer: Self-pay | Admitting: Gynecologic Oncology

## 2023-09-12 NOTE — Progress Notes (Unsigned)
GYNECOLOGIC ONCOLOGY NEW PATIENT CONSULTATION   Patient Name: [name]  Patient Age: 62 y.o. Date of Service: 09/14/23 Referring Provider: Dr. Arman Filter  Primary Care Provider: Ailene Ravel, MD Consulting Provider: Eugene Garnet, MD   Assessment/Plan:  Postmenopausal patient with remote history of cervical dysplasia and more recent history of high grade vulvar dyplasia now with low grade vaginal vs vulvar dysplasia.  We reviewed her prior history extensively (as outlined in HPI). She was treated for what sounds like high grade cervical dysplasia more than 20 years ago with one abnormal pap since her hysterectomy and HR HPV testing all negative. Her vulvar dyplasia, last treated more than 10 years ago, is presumably HPV related given prior history.  We discussed pathogenesis of vulvar cancer, arising more commonly in the setting of HPV or lichen sclerosus. I reviewed the increased risk of HPV-related vulvar dysplasia and malignancy in the setting of tobacco use. The patient voiced no interest in decreasing tobacco use or quitting.  Findings on colposcopic inspection of the distal vaginal and vulva today are consistent with at most low grade dysplasia. No additional biopsies were taken. I would not recommend treatment unless future biopsy shows high grade dysplasia or if she were to develop symptoms. I think it would be reasonable to repeat an exam with her OBGYN in 6 months and if no change, she can continue with yearly visits.   A copy of this note was sent to the patient's referring provider.   55 minutes of total time was spent for this patient encounter, including preparation, face-to-face counseling with the patient and coordination of care, and documentation of the encounter.  Eugene Garnet, MD  Division of Gynecologic Oncology  Department of Obstetrics and Gynecology  [facility]  ___________________________________________   Chief Complaint: Chief Complaint  Patient presents with   vin 1    History of Present Illness:  [name] is a 62 y.o. y.o. female who is seen in consultation at the request of Dr. Karma Greaser for an evaluation of low grade vulvar dysplasia, history of high grade vulvar dysplasia last treated > 10 years ago.  Dysplasia history per chart review: 2006 - LAVH/BSO for DUB and ovarian cyst.  Patient developed endometrial polyps while on tamoxifen, endometrial biopsy showed no hyperplasia or malignancy.  Final pathology revealed focal HPV effect, no high-grade dysplasia of the cervix.  Endometrial polyp, hyperplastic, arising in the background of disordered proliferative endometrium.  Benign fallopian tubes/ovaries. 2004 - Breast cancer treated with lumpectomy, RT, 5 years Tamoxifen. 04/2010 - WLE for VIN3, lateral margin positive. Biopsy right labia - verruca with moderate squamous dysplasia 2013: Pap NIML, HR HPV negative 07/2012 - Left labial biopsy showed VIN2/3 08/2012 - WLE for VIN2, negative margins 2015: Pap NIML 2017: Pap NIML, HR HPV negative 2019: Pap NIML, HR HPV negative 2020: Pap NIML 2021: Pap NIML 06/2021: Pap - LSIL, HR HPV negative. 06/2022: Pap - NIML 07/17/23: Pap - NIML, HR HPV negative. 08/14/23: Vulvar biopsy VIN 1.  Today, the patient presents by herself.  She notes doing well.  She denies any vulvar pruritus, irritation, pain, bleeding, or discharge.  In terms of her history, she was treated for breast cancer was diagnosed in 2003 with lumpectomy and radiation.  This was followed by adjuvant tamoxifen.  She remembers becoming menopausal on tamoxifen and that her hysterectomy was done for cervical dysplasia.  She notes a longstanding history of cervical dysplasia with treatments including an excisional procedure  and cryotherapy.  She notes that Pap test results became more normal after her pregnancy/delivery in her 30s.  Since her last surgery for vulvar dysplasia  in 2013, she denies any vulvar symptoms.  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  She is married.  PAST MEDICAL HISTORY:  Past Medical History:  Diagnosis Date   Depression    Elevated cholesterol    History of breast cancer 2003 S/P LUMPECTOMY AND RADIATION --- no recurrence   Hyperplastic colon polyp 11/02/2010   S/P radiation therapy 09/2002---11/2002   RADIATION OF LEFT  BREAST   Vulvar intraepithelial neoplasia III      PAST SURGICAL HISTORY:  Past Surgical History:  Procedure Laterality Date   ABDOMINAL SURGERY     LAPAROTOMY, SBO,LYSIS OF ADHESIONS   BREAST SURGERY  08-05-2002  DR Rometta Emery   LEFT BREAST LUMPECTOMY W/ NODE BX  ---STAGE 1 TUBULAR CA    CERVICAL BIOPSY  W/ LOOP ELECTRODE EXCISION     LEEP CONE   GYNECOLOGIC CRYOSURGERY     LAPAROSCOPIC ASSISTED VAGINAL HYSTERECTOMY  03-02-2005  DR FERNANDEZ   W/ BILATERAL SALPINGO-OOPHORECTOMY   LAPAROTOMY / LYSIS ADHESIONS FOR SMALL BOWEL OBSTRUCTION  03-15-2005  DR HOXWORTH   VIN II     right labia majora  (verruca with moderate dysplasia)   VIN III     Forchette/Wide local excision   VULVECTOMY  08/23/2012   Procedure: WIDE EXCISION VULVECTOMY;  Surgeon: Ok Edwards, MD;  Location: [facility];  Service: Gynecology;  Laterality: N/A;  WIDE LOCAL EXCISION OF LABIA MAJORA  REQUESTS ONE HOUR OR TIME   WIDE LOCAL EXCISION FORCHETTE  04/22/2000   DR. FERNANDEZ-SURGEON--VULVAR CARCINOMA INSITU    OB/GYN HISTORY:  OB History  Gravida Para Term Preterm AB Living  1 1 1   1   SAB IAB Ectopic Multiple Live Births      1    # Outcome Date GA Lbr Len/2nd Weight Sex Type Anes PTL Lv  1 Term     F Vag-Spont  N LIV    No LMP recorded. Patient has had a hysterectomy.  Age at menarche: 37  Age at menopause: 26 Hx of HRT: denies (Tamoxifen per HPI) Hx of STDs: HPV Last pap: see HPI History of abnormal pap smears: see HPI  SCREENING STUDIES:  Last mammogram: 2024  Last colonoscopy:  2023  MEDICATIONS: Outpatient Encounter Medications as of 09/14/2023  Medication Sig   buPROPion (WELLBUTRIN XL) 150 MG 24 hr tablet Take 1 tablet (150 mg total) by mouth daily.   Calcium Carbonate-Vitamin D (CALCIUM 600 + D PO) Take 2 tablets by mouth daily.   estradiol (ESTRACE VAGINAL) 0.1 MG/GM vaginal cream Place 1 g vaginally at bedtime. Rub one pea size amount into the vagina 3-4 times a week   FIBER PO Take by mouth.   rosuvastatin (CRESTOR) 40 MG tablet Take 40 mg by mouth at bedtime.   VITAMIN D PO Take 50 Int'l Units/1.57m2 by mouth.   GEMTESA 75 MG TABS TAKE 1 TABLET BY MOUTH EVERY DAY (Patient not taking: Reported on 09/12/2023)   Vibegron (GEMTESA) 75 MG TABS Take 1 tablet (75 mg total) by mouth daily. (Patient not taking: Reported on 09/12/2023)   No facility-administered encounter medications on file as of 09/14/2023.    ALLERGIES:  Allergies  Allergen Reactions   Aleve [Naproxen] Hives     FAMILY HISTORY:  Family History  Problem Relation Age of Onset   Dementia Mother  Hepatitis C Father    Liver disease Father    Breast cancer Neg Hx    Prostate cancer Neg Hx    Endometrial cancer Neg Hx    Ovarian cancer Neg Hx    Pancreatic cancer Neg Hx    Colon cancer Neg Hx      SOCIAL HISTORY:  Social Connections: Not on file    REVIEW OF SYSTEMS:  Denies appetite changes, fevers, chills, fatigue, unexplained weight changes. Denies hearing loss, neck lumps or masses, mouth sores, ringing in ears or voice changes. Denies cough or wheezing.  Denies shortness of breath. Denies chest pain or palpitations. Denies leg swelling. Denies abdominal distention, pain, blood in stools, constipation, diarrhea, nausea, vomiting, or early satiety. Denies pain with intercourse, dysuria, frequency, hematuria or incontinence. Denies hot flashes, pelvic pain, vaginal bleeding or vaginal discharge.   Denies joint pain, back pain or muscle pain/cramps. Denies itching, rash, or  wounds. Denies dizziness, headaches, numbness or seizures. Denies swollen lymph nodes or glands, denies easy bruising or bleeding. Denies anxiety, depression, confusion, or decreased concentration.  Physical Exam:  Vital Signs for this encounter:  Blood pressure 139/87, pulse 87, temperature 98.8 F (37.1 C), temperature source Oral, resp. rate 20, height 5\' 4"  (1.626 m), weight 132 lb (59.9 kg), SpO2 100%. Body mass index is 22.66 kg/m. General: Alert, oriented, no acute distress.  HEENT: Normocephalic, atraumatic. Sclera anicteric.  Chest: Clear to auscultation bilaterally. No wheezes, rhonchi, or rales. Cardiovascular: Regular rate and rhythm, no murmurs, rubs, or gallops.  Abdomen: Normoactive bowel sounds. Soft, nondistended, nontender to palpation. No masses or hepatosplenomegaly appreciated. No palpable fluid wave.  Extremities: Grossly normal range of motion. Warm, well perfused. No edema bilaterally.  Skin: No rashes or lesions.  Lymphatics: No cervical, supraclavicular, or inguinal adenopathy.  GU:  Normal external female genitalia. No lesions. No discharge or bleeding.  Vulvoscopy was performed after application of 5% acetic acid.  There was minimal acetowhite changes along the posterior fourchette at the introitus, biopsy site almost completely healed.  Minimal punctation seen.  No other areas of acetowhite noted.             Bladder/urethra:  No lesions or masses, well supported bladder             Vagina: Mildly atrophic, no lesions noted.             Cervix/uterus: surgically absent.   LABORATORY AND RADIOLOGIC DATA:  Outside medical records were reviewed to synthesize the above history, along with the history and physical obtained during the visit.   Lab Results  Component Value Date   WBC 9.4 05/20/2020   HGB 13.3 05/20/2020   HCT 40.5 05/20/2020   PLT 279 05/20/2020   GLUCOSE 84 05/20/2020   CHOL 217 (H) 05/20/2020   TRIG 201 (H) 05/20/2020   HDL 37 (L)  05/20/2020   LDLCALC 145 (H) 05/20/2020   ALT 8 05/20/2020   AST 13 05/20/2020   NA 141 05/20/2020   K 4.2 05/20/2020   CL 104 05/20/2020   CREATININE 1.10 (H) 05/20/2020   BUN 10 05/20/2020   CO2 28 05/20/2020   TSH 1.25 05/20/2020

## 2023-09-14 ENCOUNTER — Encounter: Payer: Self-pay | Admitting: Gynecologic Oncology

## 2023-09-14 ENCOUNTER — Inpatient Hospital Stay: Payer: BC Managed Care – PPO | Attending: Gynecologic Oncology | Admitting: Gynecologic Oncology

## 2023-09-14 VITALS — BP 139/87 | HR 87 | Temp 98.8°F | Resp 20 | Ht 64.0 in | Wt 132.0 lb

## 2023-09-14 DIAGNOSIS — Z79899 Other long term (current) drug therapy: Secondary | ICD-10-CM | POA: Diagnosis not present

## 2023-09-14 DIAGNOSIS — Z853 Personal history of malignant neoplasm of breast: Secondary | ICD-10-CM | POA: Diagnosis not present

## 2023-09-14 DIAGNOSIS — Z87412 Personal history of vulvar dysplasia: Secondary | ICD-10-CM | POA: Insufficient documentation

## 2023-09-14 DIAGNOSIS — F17210 Nicotine dependence, cigarettes, uncomplicated: Secondary | ICD-10-CM | POA: Insufficient documentation

## 2023-09-14 DIAGNOSIS — N900 Mild vulvar dysplasia: Secondary | ICD-10-CM | POA: Diagnosis not present

## 2023-09-14 DIAGNOSIS — Z90710 Acquired absence of both cervix and uterus: Secondary | ICD-10-CM | POA: Diagnosis not present

## 2023-09-14 DIAGNOSIS — Z923 Personal history of irradiation: Secondary | ICD-10-CM | POA: Insufficient documentation

## 2023-09-14 DIAGNOSIS — Z87410 Personal history of cervical dysplasia: Secondary | ICD-10-CM | POA: Diagnosis not present

## 2023-09-14 DIAGNOSIS — F32A Depression, unspecified: Secondary | ICD-10-CM | POA: Diagnosis not present

## 2023-09-14 DIAGNOSIS — E7800 Pure hypercholesterolemia, unspecified: Secondary | ICD-10-CM | POA: Insufficient documentation

## 2023-09-14 DIAGNOSIS — Z79890 Hormone replacement therapy (postmenopausal): Secondary | ICD-10-CM | POA: Insufficient documentation

## 2023-09-14 DIAGNOSIS — Z720 Tobacco use: Secondary | ICD-10-CM

## 2023-09-14 DIAGNOSIS — Z90722 Acquired absence of ovaries, bilateral: Secondary | ICD-10-CM | POA: Insufficient documentation

## 2023-09-14 DIAGNOSIS — B977 Papillomavirus as the cause of diseases classified elsewhere: Secondary | ICD-10-CM | POA: Diagnosis not present

## 2023-09-14 NOTE — Patient Instructions (Signed)
It was very nice to meet you.    Today, we reviewed your extensive history of cervical dysplasia and vulvar dysplasia.  Your recent biopsy showed low-grade vulvar dysplasia.  I do not see anything on your exam today that required additional biopsies.  I would recommend continued close follow-up with your OB/GYN given your history.  I will send a note to your OB/GYN regarding follow-up visit in 6 months.  If you were to develop any of the symptoms that we discussed today between visits, it is important for you to call your OB/GYN to be seen sooner.  Lease do not hesitate to reach out if you need anything in the future.  Our office number is [phone].

## 2023-11-23 DIAGNOSIS — Z79899 Other long term (current) drug therapy: Secondary | ICD-10-CM | POA: Diagnosis not present

## 2023-11-23 DIAGNOSIS — Z23 Encounter for immunization: Secondary | ICD-10-CM | POA: Diagnosis not present

## 2023-11-23 DIAGNOSIS — J439 Emphysema, unspecified: Secondary | ICD-10-CM | POA: Diagnosis not present

## 2023-11-23 DIAGNOSIS — I2510 Atherosclerotic heart disease of native coronary artery without angina pectoris: Secondary | ICD-10-CM | POA: Diagnosis not present

## 2023-11-23 DIAGNOSIS — F330 Major depressive disorder, recurrent, mild: Secondary | ICD-10-CM | POA: Diagnosis not present

## 2023-11-23 DIAGNOSIS — E785 Hyperlipidemia, unspecified: Secondary | ICD-10-CM | POA: Diagnosis not present

## 2024-03-17 ENCOUNTER — Telehealth: Payer: Self-pay | Admitting: Obstetrics and Gynecology

## 2024-03-17 DIAGNOSIS — E2839 Other primary ovarian failure: Secondary | ICD-10-CM

## 2024-03-17 NOTE — Telephone Encounter (Signed)
Needs bone scan

## 2024-04-07 DIAGNOSIS — Z1231 Encounter for screening mammogram for malignant neoplasm of breast: Secondary | ICD-10-CM | POA: Diagnosis not present

## 2024-04-07 DIAGNOSIS — M8588 Other specified disorders of bone density and structure, other site: Secondary | ICD-10-CM | POA: Diagnosis not present

## 2024-04-07 LAB — HM MAMMOGRAPHY

## 2024-04-07 LAB — HM DEXA SCAN

## 2024-04-08 DIAGNOSIS — M7751 Other enthesopathy of right foot: Secondary | ICD-10-CM | POA: Diagnosis not present

## 2024-04-10 ENCOUNTER — Ambulatory Visit: Payer: Self-pay | Admitting: Obstetrics and Gynecology

## 2024-04-15 ENCOUNTER — Encounter: Payer: Self-pay | Admitting: Obstetrics and Gynecology

## 2024-05-23 DIAGNOSIS — Z23 Encounter for immunization: Secondary | ICD-10-CM | POA: Diagnosis not present

## 2024-05-23 DIAGNOSIS — Z9181 History of falling: Secondary | ICD-10-CM | POA: Diagnosis not present

## 2024-05-23 DIAGNOSIS — Z79899 Other long term (current) drug therapy: Secondary | ICD-10-CM | POA: Diagnosis not present

## 2024-05-23 DIAGNOSIS — J439 Emphysema, unspecified: Secondary | ICD-10-CM | POA: Diagnosis not present

## 2024-05-23 DIAGNOSIS — N1831 Chronic kidney disease, stage 3a: Secondary | ICD-10-CM | POA: Diagnosis not present

## 2024-05-23 DIAGNOSIS — I2510 Atherosclerotic heart disease of native coronary artery without angina pectoris: Secondary | ICD-10-CM | POA: Diagnosis not present

## 2024-05-23 DIAGNOSIS — E785 Hyperlipidemia, unspecified: Secondary | ICD-10-CM | POA: Diagnosis not present

## 2024-05-26 DIAGNOSIS — R350 Frequency of micturition: Secondary | ICD-10-CM | POA: Diagnosis not present

## 2024-05-26 DIAGNOSIS — M545 Low back pain, unspecified: Secondary | ICD-10-CM | POA: Diagnosis not present

## 2024-06-16 DIAGNOSIS — H2513 Age-related nuclear cataract, bilateral: Secondary | ICD-10-CM | POA: Diagnosis not present

## 2024-06-16 DIAGNOSIS — H53143 Visual discomfort, bilateral: Secondary | ICD-10-CM | POA: Diagnosis not present

## 2024-06-24 ENCOUNTER — Ambulatory Visit: Admitting: Dermatology

## 2024-06-24 VITALS — BP 118/70

## 2024-06-24 DIAGNOSIS — D1801 Hemangioma of skin and subcutaneous tissue: Secondary | ICD-10-CM | POA: Diagnosis not present

## 2024-06-24 DIAGNOSIS — R238 Other skin changes: Secondary | ICD-10-CM

## 2024-06-24 DIAGNOSIS — D229 Melanocytic nevi, unspecified: Secondary | ICD-10-CM

## 2024-06-24 DIAGNOSIS — L821 Other seborrheic keratosis: Secondary | ICD-10-CM | POA: Diagnosis not present

## 2024-06-24 DIAGNOSIS — L814 Other melanin hyperpigmentation: Secondary | ICD-10-CM | POA: Diagnosis not present

## 2024-06-24 DIAGNOSIS — D485 Neoplasm of uncertain behavior of skin: Secondary | ICD-10-CM

## 2024-06-24 DIAGNOSIS — L578 Other skin changes due to chronic exposure to nonionizing radiation: Secondary | ICD-10-CM

## 2024-06-24 DIAGNOSIS — Z1283 Encounter for screening for malignant neoplasm of skin: Secondary | ICD-10-CM

## 2024-06-24 DIAGNOSIS — W908XXA Exposure to other nonionizing radiation, initial encounter: Secondary | ICD-10-CM | POA: Diagnosis not present

## 2024-06-24 NOTE — Patient Instructions (Signed)

## 2024-06-24 NOTE — Progress Notes (Unsigned)
   New Patient Visit   Subjective  [name] is a 63 y.o. female who presents for the following: Skin Cancer Screening and Full Body Skin Exam - No history of skin cancer. No family history of melanoma.  The patient presents for Total-Body Skin Exam (TBSE) for skin cancer screening and mole check. The patient has spots, moles and lesions to be evaluated, some may be new or changing and the patient may have concern these could be cancer.    The following portions of the chart were reviewed this encounter and updated as appropriate: medications, allergies, medical history  Review of Systems:  No other skin or systemic complaints except as noted in HPI or Assessment and Plan.  Objective  Well appearing patient in no apparent distress; mood and affect are within normal limits.  A full examination was performed including scalp, head, eyes, ears, nose, lips, neck, chest, axillae, abdomen, back, buttocks, bilateral upper extremities, bilateral lower extremities, hands, feet, fingers, toes, fingernails, and toenails. All findings within normal limits unless otherwise noted below.   Relevant physical exam findings are noted in the Assessment and Plan.  Left lower cheek 5 mm irregular brown macule   Assessment & Plan   SKIN CANCER SCREENING PERFORMED TODAY.  ACTINIC DAMAGE - Chronic condition, secondary to cumulative UV/sun exposure - diffuse scaly erythematous macules with underlying dyspigmentation - Recommend daily broad spectrum sunscreen SPF 30+ to sun-exposed areas, reapply every 2 hours as needed.  - Staying in the shade or wearing long sleeves, sun glasses (UVA+UVB protection) and wide brim hats (4-inch brim around the entire circumference of the hat) are also recommended for sun protection.  - Call for new or changing lesions.  LENTIGINES, SEBORRHEIC KERATOSES, HEMANGIOMAS - Benign normal skin lesions - Benign-appearing - Call for any changes  MELANOCYTIC NEVI -  Tan-brown and/or pink-flesh-colored symmetric macules and papules - Benign appearing on exam today - Observation - Call clinic for new or changing moles - Recommend daily use of broad spectrum spf 30+ sunscreen to sun-exposed areas.    Inflammatory papule Exam: Red papule of right cheek  Treatment Plan: Zoryve cream daily - sample given  Will plan biopsy if persistent.     NEOPLASM OF UNCERTAIN BEHAVIOR OF SKIN Left lower cheek Epidermal / dermal shaving  Lesion diameter (cm):  0.5 Informed consent: discussed and consent obtained   Timeout: patient name, date of birth, surgical site, and procedure verified   Procedure prep:  Patient was prepped and draped in usual sterile fashion Prep type:  Isopropyl alcohol Anesthesia: the lesion was anesthetized in a standard fashion   Anesthetic:  1% lidocaine  w/ epinephrine  1-100,000 buffered w/ 8.4% NaHCO3 Instrument used: flexible razor blade   Hemostasis achieved with: pressure, aluminum chloride and electrodesiccation   Outcome: patient tolerated procedure well   Post-procedure details: sterile dressing applied and wound care instructions given   Dressing type: bandage and petrolatum    Specimen 1 - Surgical pathology Differential Diagnosis: Nevus vs dysplastic nevus  Check Margins: No  Return in about 1 year (around 06/24/2025) for TBSE.  I, Roseline Hutchinson, CMA, am acting as scribe for [employer], DO .   Documentation: I have reviewed the above documentation for accuracy and completeness, and I agree with the above.  Delon Lenis, DO

## 2024-06-25 ENCOUNTER — Ambulatory Visit: Payer: Self-pay | Admitting: Dermatology

## 2024-06-25 ENCOUNTER — Encounter: Payer: Self-pay | Admitting: Dermatology

## 2024-06-25 LAB — SURGICAL PATHOLOGY

## 2024-07-17 ENCOUNTER — Ambulatory Visit (INDEPENDENT_AMBULATORY_CARE_PROVIDER_SITE_OTHER): Admitting: Obstetrics and Gynecology

## 2024-07-17 ENCOUNTER — Encounter: Payer: Self-pay | Admitting: Obstetrics and Gynecology

## 2024-07-17 VITALS — BP 118/60 | HR 78 | Ht 62.99 in | Wt 126.6 lb

## 2024-07-17 DIAGNOSIS — N900 Mild vulvar dysplasia: Secondary | ICD-10-CM | POA: Diagnosis not present

## 2024-07-17 DIAGNOSIS — N952 Postmenopausal atrophic vaginitis: Secondary | ICD-10-CM

## 2024-07-17 DIAGNOSIS — Z9189 Other specified personal risk factors, not elsewhere classified: Secondary | ICD-10-CM | POA: Diagnosis not present

## 2024-07-17 MED ORDER — ESTRADIOL 0.01 % VA CREA: 1.0000 | TOPICAL_CREAM | Freq: Every day | VAGINAL | 0 refills | Status: AC

## 2024-07-17 NOTE — Progress Notes (Signed)
 63 y.o. y.o. female here for medicare annual exam. No LMP recorded. Patient has had a hysterectomy.    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  Daughter is 31 yo.   RP:  h/o VIN3 with excision.  Denies any pruritus today   HPI: Status post LAVH/BSO in 2006.  Well on no hormone replacement therapy.  History of left breast cancer in 2004.  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  She had lumpectomy followed by radiation and then tamoxifen for 5 years.  Breasts normal now.  Recent screening mammogram 03/2022 was Benign.  History of VIN 3 post wide excision in 2011.  She had a recurrence of the VIN 3 in 2013 for which an excision was done showing negative margins.  Last Colpo of vulva 09/2018 Very mild AW, no Bx done. Pap Neg in 2021, but LGSIL in 06/2021.  Pap reflex today.  Patient is having no pelvic pain.  She is sexually active with her husband without difficulty or pain.  BMI 22.5. 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  Colono Benign Polyps 08/2021, repeat at 3 yrs.  BD 04/2021 Osteopenia T-Score -1.7.  Health labs with Fam MD.   Recent colposcopy 06/29/25 LGSIL/VIN1 seen Dr. Viktoria and to monitor for any concerning s/s No current pruritus. Saw Dr. Guadlupe and bladder was fine. Recommended estrogen Saw dermatology. One mole removed and benign. Continue annual care.  Body mass index is 22.43 kg/m.    Blood pressure 118/60, pulse 78, height 5' 2.99 (1.6 m), weight 126 lb 9.6 oz (57.4 kg), SpO2 96%.     Component Value Date/Time   DIAGPAP  07/17/2023 0839    - Negative for intraepithelial lesion or malignancy (NILM)   DIAGPAP  06/15/2022 1353    - Negative for Intraepithelial Lesions or Malignancy (NILM)   DIAGPAP - Benign reactive/reparative changes 06/15/2022 1353   HPVHIGH Negative 07/17/2023 0839   HPVHIGH Negative 06/13/2021 1017   ADEQPAP  07/17/2023 0839    Satisfactory for evaluation; transformation zone component ABSENT.   ADEQPAP Satisfactory for evaluation.  06/15/2022 1353   ADEQPAP Satisfactory for evaluation. 06/13/2021 1017    GYN HISTORY:    Component Value Date/Time   DIAGPAP  07/17/2023 0839    - Negative for intraepithelial lesion or malignancy (NILM)   DIAGPAP  06/15/2022 1353    - Negative for Intraepithelial Lesions or Malignancy (NILM)   DIAGPAP - Benign reactive/reparative changes 06/15/2022 1353   HPVHIGH Negative 07/17/2023 0839   HPVHIGH Negative 06/13/2021 1017   ADEQPAP  07/17/2023 0839    Satisfactory for evaluation; transformation zone component ABSENT.   ADEQPAP Satisfactory for evaluation. 06/15/2022 1353   ADEQPAP Satisfactory for evaluation. 06/13/2021 1017    OB History  Gravida Para Term Preterm AB Living  1 1 1   1   SAB IAB Ectopic Multiple Live Births      1    # Outcome Date GA Lbr Len/2nd Weight Sex Type Anes PTL Lv  1 Term     F Vag-Spont  N LIV    Past Medical History:  Diagnosis Date   Depression    Elevated cholesterol    History of breast cancer 2003 S/P LUMPECTOMY AND RADIATION --- no recurrence   Hyperplastic colon polyp 11/02/2010   S/P radiation therapy 09/2002---11/2002   RADIATION OF LEFT  BREAST   Vulvar intraepithelial neoplasia III     Past Surgical History:  Procedure Laterality Date   ABDOMINAL  SURGERY     LAPAROTOMY, SBO,LYSIS OF ADHESIONS   BREAST SURGERY  08-05-2002  DR DANETTE   LEFT BREAST LUMPECTOMY W/ NODE BX  ---STAGE 1 TUBULAR CA    CERVICAL BIOPSY  W/ LOOP ELECTRODE EXCISION     LEEP CONE   GYNECOLOGIC CRYOSURGERY     LAPAROSCOPIC ASSISTED VAGINAL HYSTERECTOMY  03-02-2005  DR FERNANDEZ   W/ BILATERAL SALPINGO-OOPHORECTOMY   LAPAROTOMY / LYSIS ADHESIONS FOR SMALL BOWEL OBSTRUCTION  03-15-2005  DR HOXWORTH   VIN II     right labia majora  (verruca with moderate dysplasia)   VIN III     Forchette/Wide local excision   VULVECTOMY  08/23/2012   Procedure: WIDE EXCISION VULVECTOMY;  Surgeon: Curlee VEAR Guan, MD;  Location: [facility];  Service:  Gynecology;  Laterality: N/A;  WIDE LOCAL EXCISION OF LABIA MAJORA  REQUESTS ONE HOUR OR TIME   WIDE LOCAL EXCISION FORCHETTE  04/22/2000   DR. FERNANDEZ-SURGEON--VULVAR CARCINOMA INSITU    Current Outpatient Medications on File Prior to Visit  Medication Sig Dispense Refill   buPROPion  (WELLBUTRIN  XL) 150 MG 24 hr tablet Take 1 tablet (150 mg total) by mouth daily. 90 tablet 4   Calcium Carbonate-Vitamin D  (CALCIUM 600 + D PO) Take 2 tablets by mouth daily.     estradiol  (ESTRACE  VAGINAL) 0.1 MG/GM vaginal cream Place 1 g vaginally at bedtime. Rub one pea size amount into the vagina 3-4 times a week 42.5 g 12   FIBER PO Take by mouth.     rosuvastatin (CRESTOR) 40 MG tablet Take 40 mg by mouth at bedtime.     VITAMIN D  PO Take 50 Int'l Units/1.7m2 by mouth.     No current facility-administered medications on file prior to visit.    Social History   Socioeconomic History   Marital status: Married    Spouse name: Not on file   Number of children: Not on file   Years of education: Not on file   Highest education level: Not on file  Occupational History   Not on file  Tobacco Use   Smoking status: Every Day    Current packs/day: 0.50    Average packs/day: 0.5 packs/day for 22.0 years (11.0 ttl pk-yrs)    Types: Cigarettes   Smokeless tobacco: Never  Vaping Use   Vaping status: Never Used  Substance and Sexual Activity   Alcohol use: No    Alcohol/week: 0.0 standard drinks of alcohol   Drug use: No   Sexual activity: Yes    Birth control/protection: Surgical, Other-see comments    Comment: hysterectomy, HUSBAND HAD VASECTOMY  Other Topics Concern   Not on file  Social History Narrative   Not on file   Social Drivers of Health   Financial Resource Strain: Not on file  Food Insecurity: No Food Insecurity (09/12/2023)   Hunger Vital Sign    Worried About Running Out of Food in the Last Year: Never true    Ran Out of Food in the Last Year: Never true  Transportation  Needs: No Transportation Needs (09/12/2023)   PRAPARE - Administrator, Civil Service (Medical): No    Lack of Transportation (Non-Medical): No  Physical Activity: Not on file  Stress: Not on file  Social Connections: Not on file  Intimate Partner Violence: Not At Risk (09/12/2023)   Humiliation, Afraid, Rape, and Kick questionnaire    Fear of Current or Ex-Partner: No    Emotionally Abused: No  Physically Abused: No    Sexually Abused: No    Family History  Problem Relation Age of Onset   Dementia Mother    Hepatitis C Father    Liver disease Father    Breast cancer Neg Hx    Prostate cancer Neg Hx    Endometrial cancer Neg Hx    Ovarian cancer Neg Hx    Pancreatic cancer Neg Hx    Colon cancer Neg Hx      Allergies  Allergen Reactions   Aleve [Naproxen] Hives      Patient's last menstrual period was No LMP recorded. Patient has had a hysterectomy..            Review of Systems Alls systems reviewed and are negative.     Physical Exam Constitutional:      Appearance: Normal appearance.  Genitourinary:     Vulva normal.     No lesions in the vagina.     Right Labia: No rash, lesions or skin changes.    Left Labia: No lesions, skin changes or rash.    Vaginal cuff intact.    No vaginal discharge or tenderness.     No vaginal prolapse present.    Moderate vaginal atrophy present.     Right Adnexa: not absent.    Left Adnexa: not absent.    Cervix is not absent.     Uterus is not absent. Breasts:    Right: Normal.     Left: Normal.  HENT:     Head: Normocephalic.  Neck:     Thyroid : No thyroid  mass, thyromegaly or thyroid  tenderness.  Cardiovascular:     Rate and Rhythm: Normal rate and regular rhythm.     Heart sounds: Normal heart sounds, S1 normal and S2 normal.  Pulmonary:     Effort: Pulmonary effort is normal.     Breath sounds: Normal breath sounds and air entry.  Abdominal:     General: Bowel sounds are normal. There is no  distension.     Palpations: Abdomen is soft. There is no mass.     Tenderness: There is no abdominal tenderness. There is no guarding or rebound.  Musculoskeletal:     Cervical back: Full passive range of motion without pain, normal range of motion and neck supple. No tenderness.     Right lower leg: No edema.     Left lower leg: No edema.  Neurological:     Mental Status: She is alert.  Skin:    General: Skin is warm.  Psychiatric:        Mood and Affect: Mood normal.        Behavior: Behavior normal.        Thought Content: Thought content normal.  Vitals and nursing note reviewed. Exam conducted with a chaperone present.       A:         Well Woman medicare GYN exam                             P:        Pap smear not indicated Encouraged annual mammogram screening Colon cancer screening Patient will schedule DXA up-to-date Labs and immunizations to do with PMD Discussed breast self exams Encouraged healthy lifestyle practices Encouraged Vit D and Calcium   No follow-ups on file.  [name]

## 2024-09-27 ENCOUNTER — Other Ambulatory Visit: Payer: Self-pay | Admitting: Obstetrics and Gynecology

## 2024-09-29 NOTE — Telephone Encounter (Signed)
 Med refill request:   buPROPion  (WELLBUTRIN  XL) 150 MG 24 hr tablet  Start:  07/17/23 Disp: 90 tablets Refills:  4  Last OV: 07/17/24: GYN exam for high-risk Medicare patient  Last AEX:  07/17/23 Next AEX:  Not yet scheduled Last MMG (if hormonal med):  04/07/24 Refill authorized? Please Advise.

## 2024-10-13 ENCOUNTER — Encounter: Payer: Self-pay | Admitting: *Deleted

## 2025-06-29 ENCOUNTER — Ambulatory Visit: Admitting: Dermatology
